# Patient Record
Sex: Female | Born: 1955
Health system: Southern US, Community
[De-identification: ages and names within clinical notes are randomized; demographics above are authoritative.]

## PROBLEM LIST (undated history)

## (undated) DIAGNOSIS — E669 Obesity, unspecified: Secondary | ICD-10-CM

## (undated) DIAGNOSIS — F32A Depression, unspecified: Secondary | ICD-10-CM

## (undated) DIAGNOSIS — R519 Headache, unspecified: Secondary | ICD-10-CM

## (undated) DIAGNOSIS — E8941 Symptomatic postprocedural ovarian failure: Secondary | ICD-10-CM

## (undated) DIAGNOSIS — N329 Bladder disorder, unspecified: Secondary | ICD-10-CM

## (undated) DIAGNOSIS — M720 Palmar fascial fibromatosis [Dupuytren]: Secondary | ICD-10-CM

## (undated) DIAGNOSIS — R51 Headache: Secondary | ICD-10-CM

## (undated) DIAGNOSIS — M722 Plantar fascial fibromatosis: Secondary | ICD-10-CM

## (undated) DIAGNOSIS — E782 Mixed hyperlipidemia: Secondary | ICD-10-CM

## (undated) DIAGNOSIS — F329 Major depressive disorder, single episode, unspecified: Secondary | ICD-10-CM

## (undated) HISTORY — PX: ANKLE FRACTURE SURGERY: SHX122

## (undated) HISTORY — DX: Bladder disorder, unspecified: N32.9

## (undated) HISTORY — DX: Palmar fascial fibromatosis (dupuytren): M72.0

## (undated) HISTORY — DX: Depression, unspecified: F32.A

## (undated) HISTORY — DX: Symptomatic postprocedural ovarian failure: E89.41

## (undated) HISTORY — DX: Headache, unspecified: R51.9

## (undated) HISTORY — DX: Obesity, unspecified: E66.9

## (undated) HISTORY — DX: Major depressive disorder, single episode, unspecified: F32.9

## (undated) HISTORY — DX: Mixed hyperlipidemia: E78.2

## (undated) HISTORY — DX: Headache: R51

## (undated) HISTORY — DX: Plantar fascial fibromatosis: M72.2

---

## 2004-06-11 ENCOUNTER — Encounter: Payer: Self-pay | Admitting: Family Medicine

## 2004-06-11 LAB — CONVERTED CEMR LAB

## 2004-06-11 LAB — HM MAMMOGRAPHY

## 2004-11-16 ENCOUNTER — Ambulatory Visit: Payer: Self-pay | Admitting: Family Medicine

## 2005-08-02 ENCOUNTER — Ambulatory Visit: Payer: Self-pay | Admitting: Family Medicine

## 2006-05-19 ENCOUNTER — Ambulatory Visit: Payer: Self-pay | Admitting: Family Medicine

## 2006-08-22 ENCOUNTER — Ambulatory Visit: Payer: Self-pay | Admitting: Family Medicine

## 2006-12-12 HISTORY — PX: VAGINAL HYSTERECTOMY: SUR661

## 2007-04-04 ENCOUNTER — Ambulatory Visit: Payer: Self-pay | Admitting: Family Medicine

## 2007-04-25 ENCOUNTER — Telehealth: Payer: Self-pay | Admitting: Family Medicine

## 2007-10-22 ENCOUNTER — Ambulatory Visit: Payer: Self-pay | Admitting: Family Medicine

## 2007-10-22 DIAGNOSIS — E669 Obesity, unspecified: Secondary | ICD-10-CM | POA: Insufficient documentation

## 2007-10-23 ENCOUNTER — Ambulatory Visit: Payer: Self-pay | Admitting: Family Medicine

## 2007-10-25 LAB — CONVERTED CEMR LAB
ALT: 17 units/L (ref 0–35)
AST: 19 units/L (ref 0–37)
Bilirubin, Direct: 0.1 mg/dL (ref 0.0–0.3)
Eosinophils Relative: 3.2 % (ref 0.0–5.0)
HCT: 38.5 % (ref 36.0–46.0)
Neutrophils Relative %: 68.4 % (ref 43.0–77.0)
RBC: 4.4 M/uL (ref 3.87–5.11)
RDW: 13.1 % (ref 11.5–14.6)
Total CHOL/HDL Ratio: 4.4
Total Protein: 7.5 g/dL (ref 6.0–8.3)
Triglycerides: 325 mg/dL (ref 0–149)
WBC: 6 10*3/uL (ref 4.5–10.5)

## 2007-10-31 LAB — CONVERTED CEMR LAB
ALT: 17 units/L (ref 0–35)
AST: 19 units/L (ref 0–37)
Basophils Relative: 0.6 % (ref 0.0–1.0)
Bilirubin, Direct: 0.1 mg/dL (ref 0.0–0.3)
CO2: 27 meq/L (ref 19–32)
Calcium: 9.6 mg/dL (ref 8.4–10.5)
Creatinine, Ser: 0.6 mg/dL (ref 0.4–1.2)
Eosinophils Relative: 3.2 % (ref 0.0–5.0)
GFR calc Af Amer: 137 mL/min
Glucose, Bld: 67 mg/dL — ABNORMAL LOW (ref 70–99)
Hemoglobin: 13.4 g/dL (ref 12.0–15.0)
Lymphocytes Relative: 22.6 % (ref 12.0–46.0)
Neutro Abs: 4.1 10*3/uL (ref 1.4–7.7)
Platelets: 333 10*3/uL (ref 150–400)
Total Bilirubin: 0.6 mg/dL (ref 0.3–1.2)
Total Protein: 7.5 g/dL (ref 6.0–8.3)
VLDL: 65 mg/dL — ABNORMAL HIGH (ref 0–40)
WBC: 6 10*3/uL (ref 4.5–10.5)

## 2007-12-04 ENCOUNTER — Ambulatory Visit: Payer: Self-pay | Admitting: Family Medicine

## 2007-12-04 DIAGNOSIS — F329 Major depressive disorder, single episode, unspecified: Secondary | ICD-10-CM

## 2007-12-04 DIAGNOSIS — E782 Mixed hyperlipidemia: Secondary | ICD-10-CM | POA: Insufficient documentation

## 2007-12-04 DIAGNOSIS — E785 Hyperlipidemia, unspecified: Secondary | ICD-10-CM | POA: Insufficient documentation

## 2007-12-04 DIAGNOSIS — J309 Allergic rhinitis, unspecified: Secondary | ICD-10-CM | POA: Insufficient documentation

## 2007-12-04 DIAGNOSIS — N329 Bladder disorder, unspecified: Secondary | ICD-10-CM | POA: Insufficient documentation

## 2007-12-04 DIAGNOSIS — F32A Depression, unspecified: Secondary | ICD-10-CM | POA: Insufficient documentation

## 2008-01-15 ENCOUNTER — Ambulatory Visit: Payer: Self-pay | Admitting: Family Medicine

## 2008-01-21 ENCOUNTER — Ambulatory Visit: Payer: Self-pay | Admitting: Family Medicine

## 2008-01-22 LAB — CONVERTED CEMR LAB
ALT: 14 units/L (ref 0–35)
AST: 18 units/L (ref 0–37)
Basophils Relative: 0.9 % (ref 0.0–1.0)
Bilirubin, Direct: 0.1 mg/dL (ref 0.0–0.3)
Calcium: 9 mg/dL (ref 8.4–10.5)
Chloride: 104 meq/L (ref 96–112)
Creatinine, Ser: 0.6 mg/dL (ref 0.4–1.2)
Eosinophils Relative: 3.8 % (ref 0.0–5.0)
Glucose, Bld: 91 mg/dL (ref 70–99)
HCT: 38 % (ref 36.0–46.0)
LDL Cholesterol: 115 mg/dL — ABNORMAL HIGH (ref 0–99)
Neutrophils Relative %: 58.8 % (ref 43.0–77.0)
Platelets: 312 10*3/uL (ref 150–400)
RBC: 4.34 M/uL (ref 3.87–5.11)
RDW: 12.4 % (ref 11.5–14.6)
Sodium: 141 meq/L (ref 135–145)
TSH: 1.9 microintl units/mL (ref 0.35–5.50)
Total Bilirubin: 0.3 mg/dL (ref 0.3–1.2)
Total CHOL/HDL Ratio: 3.7
VLDL: 30 mg/dL (ref 0–40)
WBC: 5.4 10*3/uL (ref 4.5–10.5)

## 2008-02-09 ENCOUNTER — Ambulatory Visit: Payer: Self-pay | Admitting: Family Medicine

## 2008-02-09 ENCOUNTER — Encounter: Payer: Self-pay | Admitting: Family Medicine

## 2008-02-19 ENCOUNTER — Telehealth: Payer: Self-pay | Admitting: Family Medicine

## 2008-04-14 ENCOUNTER — Telehealth: Payer: Self-pay | Admitting: Family Medicine

## 2008-06-03 ENCOUNTER — Ambulatory Visit: Payer: Self-pay | Admitting: Family Medicine

## 2008-07-11 ENCOUNTER — Ambulatory Visit: Payer: Self-pay | Admitting: Family Medicine

## 2008-08-04 ENCOUNTER — Telehealth (INDEPENDENT_AMBULATORY_CARE_PROVIDER_SITE_OTHER): Payer: Self-pay | Admitting: *Deleted

## 2008-08-11 ENCOUNTER — Ambulatory Visit: Payer: Self-pay | Admitting: Family Medicine

## 2008-08-11 DIAGNOSIS — J45909 Unspecified asthma, uncomplicated: Secondary | ICD-10-CM | POA: Insufficient documentation

## 2008-09-22 ENCOUNTER — Telehealth: Payer: Self-pay | Admitting: Family Medicine

## 2009-02-20 ENCOUNTER — Telehealth: Payer: Self-pay | Admitting: Family Medicine

## 2009-04-25 ENCOUNTER — Ambulatory Visit: Payer: Self-pay | Admitting: Family Medicine

## 2009-06-08 ENCOUNTER — Ambulatory Visit: Payer: Self-pay | Admitting: Family Medicine

## 2009-09-04 ENCOUNTER — Ambulatory Visit: Payer: Self-pay | Admitting: Family Medicine

## 2009-09-04 DIAGNOSIS — E8941 Symptomatic postprocedural ovarian failure: Secondary | ICD-10-CM | POA: Insufficient documentation

## 2009-09-14 ENCOUNTER — Telehealth: Payer: Self-pay | Admitting: Family Medicine

## 2009-09-15 ENCOUNTER — Telehealth: Payer: Self-pay | Admitting: Family Medicine

## 2010-01-23 ENCOUNTER — Ambulatory Visit: Payer: Self-pay | Admitting: Family Medicine

## 2010-03-16 ENCOUNTER — Telehealth: Payer: Self-pay | Admitting: Family Medicine

## 2010-03-16 ENCOUNTER — Ambulatory Visit: Payer: Self-pay | Admitting: Family Medicine

## 2010-03-16 DIAGNOSIS — M65331 Trigger finger, right middle finger: Secondary | ICD-10-CM | POA: Insufficient documentation

## 2010-03-16 DIAGNOSIS — M720 Palmar fascial fibromatosis [Dupuytren]: Secondary | ICD-10-CM | POA: Insufficient documentation

## 2010-03-26 ENCOUNTER — Encounter (INDEPENDENT_AMBULATORY_CARE_PROVIDER_SITE_OTHER): Payer: Self-pay | Admitting: *Deleted

## 2010-04-30 ENCOUNTER — Telehealth: Payer: Self-pay | Admitting: Family Medicine

## 2010-07-05 ENCOUNTER — Ambulatory Visit: Payer: Self-pay | Admitting: Family Medicine

## 2010-08-02 ENCOUNTER — Telehealth: Payer: Self-pay | Admitting: Family Medicine

## 2010-08-27 ENCOUNTER — Telehealth (INDEPENDENT_AMBULATORY_CARE_PROVIDER_SITE_OTHER): Payer: Self-pay | Admitting: *Deleted

## 2010-08-30 ENCOUNTER — Encounter (INDEPENDENT_AMBULATORY_CARE_PROVIDER_SITE_OTHER): Payer: Self-pay | Admitting: *Deleted

## 2010-09-07 ENCOUNTER — Telehealth: Payer: Self-pay | Admitting: Family Medicine

## 2011-01-11 NOTE — Progress Notes (Signed)
Summary: pt is out of town  Phone Note From Other Clinic   Caller: call a nurse Summary of Call: Pt left message with call a nurse that she is still in [place], her father passed away, she will call to reschedule her appt when she returns home.  Initial call taken by: Lowella Petties CMA,  September 07, 2010 10:47 AM  Follow-up for Phone Call        thanks for the heads up - do not charge her for no show please Follow-up by: Judith Part MD,  September 07, 2010 11:15 AM  Additional Follow-up for Phone Call Additional follow up Details #1::        I lt Lupita Leash know not to charge for 08/30/10. Additional Follow-up by: Lowella Petties CMA,  September 07, 2010 11:27 AM

## 2011-01-11 NOTE — Assessment & Plan Note (Signed)
Summary: FILL OUT FORM FOR WORK/[facility]   Vital Signs:  Patient profile:   55 year old female Height:      63 inches Weight:      204.50 pounds BMI:     36.36 Temp:     98 degrees F oral Pulse rate:   76 / minute Pulse rhythm:   regular BP sitting:   116 / 70  (left arm) Cuff size:   large  Vitals Entered By: Lewanda Rife LPN (March 16, 453 9:24 AM) CC: Form for work   History of Present Illness: is doing fine ok   is setting up for agency so she can get help with her parents -- they are in [place]  this is application for agency for [employer] -- to be on list for job   all titers show immunity   need dates for hep A and B vaccines- pt will obt these   has dupytens- in her R hand - is interested in opt for tx because is is starting to hurt and aff mobility in her hand    Allergies: 1)  ! Amoxicillin  Past History:  Past Medical History: Last updated: 09/04/2009 Allergic rhinitis Depression asthma- mild  plantar fasciitis   Past Surgical History: Last updated: 12/04/2007 Hysterectomy- vaginal.  Enterocele repair (2008)  Family History: Last updated: 01/15/2008 father- heart probs in old age/chf, prostate ca, smoker, PVD mother very healthy at 44  Social History: Last updated: 01/15/2008 works in ER- mental health- [employer] Never Smoked  Risk Factors: Smoking Status: never (01/15/2008)  Review of Systems General:  Denies fatigue, fever, loss of appetite, and malaise. Eyes:  Denies blurring and eye pain. CV:  Denies chest pain or discomfort and palpitations. Resp:  Denies cough, shortness of breath, sputum productive, and wheezing. GI:  Denies abdominal pain, change in bowel habits, indigestion, loss of appetite, nausea, and vomiting. MS:  Denies joint pain. Derm:  Denies itching, lesion(s), poor wound healing, and rash. Neuro:  Denies numbness and tingling. Psych:  Denies anxiety and depression; mood has been quite stable. Endo:  Denies cold intolerance,  excessive thirst, excessive urination, and heat intolerance. Heme:  Denies abnormal bruising and bleeding.  Physical Exam  General:  overweight but generally well appearing  Head:  normocephalic, atraumatic, and no abnormalities observed.   Eyes:  vision grossly intact, pupils equal, pupils round, and pupils reactive to light.  no conjunctival pallor, injection or icterus  Mouth:  pharynx pink and moist.   Neck:  supple with full rom and no masses or thyromegally, no JVD or carotid bruit  Chest Wall:  No deformities, masses, or tenderness noted. Lungs:  Normal respiratory effort, chest expands symmetrically. Lungs are clear to auscultation, no crackles or wheezes. Heart:  Normal rate and regular rhythm. S1 and S2 normal without gallop, murmur, click, rub or other extra sounds. Abdomen:  Bowel sounds positive,abdomen soft and non-tender without masses, organomegaly or hernias noted. Msk:  No deformity or scoliosis noted of thoracic or lumbar spine.  no acute joint changes thickening of tissues overlying tendons in R palm that are slt tender no redness or warmth no finger triggering  Extremities:  No clubbing, cyanosis, edema, or deformity noted with normal full range of motion of all joints.   Neurologic:  sensation intact to light touch, gait normal, and DTRs symmetrical and normal.   Skin:  Intact without suspicious lesions or rashes Cervical Nodes:  No lymphadenopathy noted Psych:  normal affect, talkative and  pleasant    Impression & Recommendations:  Problem # 1:  OTH GENERAL MEDICAL EXAMINATION ADMIN PURPOSES (ICD-V70.3) Assessment Comment Only for new job poss in [place] no restricions  pend dates of hep vaccines to finish  all titers nl  forms filled out   Problem # 2:  DUPUYTREN'S CONTRACTURE (ICD-728.6) Assessment: Deteriorated ref to hand specialist due to increasing symptoms and problems with mobility of hand  Orders: Orthopedic Referral (Ortho)  Complete  Medication List: 1)  Zoloft 100 Mg Tabs (Sertraline hcl) .... One by mouth once daily 2)  Premarin 0.625 Mg Tabs (Estrogens conjugated) .... Take one by mouth daily 3)  Ambien 10 Mg Tabs (Zolpidem tartrate) .... 1/2 to 1 by mouth at bedtime as needed 4)  Zyrtec Allergy 10 Mg Tabs (Cetirizine hcl) .Marland Kitchen.. 1 daily as needed by mouth 5)  Advair Diskus 100-50 Mcg/dose Misc (Fluticasone-salmeterol) .... Take 1 inhalation two times daily as needed 6)  Nasacort Aq 55 Mcg/act Aers (Triamcinolone acetonide(nasal)) .... Use 2 sprays in each nostril once daily as needed 7)  Multivitamins Tabs (Multiple vitamin) .... Daily  Patient Instructions: 1)  we will do hand doctor referral at check out  2)  keep working on healthy diet and exercise  3)  please get dates of your hep B and hep A immunizations  4)  then I will return your form  5)  no restrictions for new job   Current Allergies (reviewed today): ! AMOXICILLIN   Immunization History:  Tetanus/Td Immunization History:    Tetanus/Td:  historical (08/27/2009)  Influenza Immunization History:    Influenza:  historical (09/07/2009)

## 2011-01-11 NOTE — Letter (Signed)
Summary: Unable To Reach-Consult Scheduled  [place] at [facility]  [address] [place], [place] [address]   Phone: [phone]  Fax: [phone]    03/26/2010 MRN: [id number]    Dear Ms. [place],   We have been unable to reach you by phone.  Please contact our office with an updated phone number.  At the recommendation of Dr.___Marne Milinda Antis, we have been asked to schedule you a consult with  Orthopedic Physician Referral.  Our telephone number is [phone]. If you wish to decline an appointment. Please call our office.    If you have any question please call us.     Thank you,  Patient Care Coordinator [place] at [facility]

## 2011-01-11 NOTE — Progress Notes (Signed)
Summary: Vaccine dates  Phone Note Call from Patient Call back at [phone]   Caller: Patient Call For: Judith Part MD Summary of Call: Patient called to give dates of shots: Hep B 12/17/1992, 01/22/1993, and 07/16/1993.  Hep A 03/16/2010.  Please call her and let her know when she can pick up form.   Initial call taken by: Linde Gillis CMA Duncan Dull),  March 16, 2010 11:08 AM  Follow-up for Phone Call        please fill in on her form and scan it and call her for pick up - thanks  Follow-up by: Judith Part MD,  March 16, 2010 11:30 AM  Additional Follow-up for Phone Call Additional follow up Details #1::        Left message for patient to call back. Completed form has been copied for scanning and original is at front desk for pick up.Lewanda Rife LPN  March 16, 864 12:32 PM   Advised pt ok to pick up form.         Lowella Petties CMA  March 16, 2010 1:26 PM

## 2011-01-11 NOTE — Letter (Signed)
Summary: [place] No Show Letter  [place] at [facility]  [address] [place], [place] [address]   Phone: [phone]  Fax: [phone]    08/30/2010 MRN: [id number]  Ariel Compton [address] [place], [place]  [address]   Dear Ms. [place],   Our records indicate that you missed your scheduled appointment with ___lab__________________ on __9.19.11__________.  Please contact this office to reschedule your appointment as soon as possible.  It is important that you keep your scheduled appointments with your physician, so we can provide you the best care possible.  Please be advised that there may be a charge for "no show" appointments.    Sincerely,   [place] at [facility]

## 2011-01-11 NOTE — Progress Notes (Signed)
Summary: premarin  Phone Note Refill Request Call back at [phone] Message from:  Patient on August 02, 2010 10:53 AM  Refills Requested: Medication #1:  PREMARIN 0.625 MG  TABS take one by mouth daily Call husband when ready for pick up.   Initial call taken by: Melody Comas,  August 02, 2010 10:53 AM  Follow-up for Phone Call        her physical is already scheduled with me printed in put in nurse in box for pickup  Follow-up by: Judith Part MD,  August 02, 2010 11:19 AM  Additional Follow-up for Phone Call Additional follow up Details #1::        Prescription left at front desk. Left message for patient to call back. Lewanda Rife LPN  August 02, 2010 12:54 PM   Patient notified as instructed by telephone. Pt's husband will pick up rx on Friday.08/06/10. Lewanda Rife LPN  August 03, 2010 10:45 AM     Prescriptions: PREMARIN 0.625 MG  TABS (ESTROGENS CONJUGATED) take one by mouth daily  #90 x 3   Entered and Authorized by:   Judith Part MD   Signed by:   Judith Part MD on 08/02/2010   Method used:   Print then Give to Patient   RxID:   [id number]

## 2011-01-11 NOTE — Assessment & Plan Note (Signed)
Summary: sore throat cough x 3 weeks/rbh   Vital Signs:  Patient profile:   55 year old female Weight:      199 pounds O2 Sat:      97 % on Room air Temp:     98.1 degrees F oral Pulse rate:   82 / minute BP sitting:   124 / 80  (left arm)  Vitals Entered By: Doristine Devoid (January 23, 2010 9:30 AM)  O2 Flow:  Room air CC: sore throat and cough    History of Present Illness: Pt here Sat with fever to 101, taking Tyl. She works in the ED, sxs for couple wks...she has headache in the frontal area altho none now, some,  ear pain, rhinitis that is clear, mild ST recently, cough productive green occas, some SOB with stairs (uses Advair regularly this time of year), no N/V. She has taken Robitussin DM and Tyl and Nasocort.  Problems Prior to Update: 1)  Menopause, Surgical  (ICD-627.4) 2)  Other Screening Mammogram  (ICD-V76.12) 3)  Plantar Fasciitis, Right  (ICD-728.71) 4)  Asthma, Persistent, Mild  (ICD-493.90) 5)  Fatigue  (ICD-780.79) 6)  Other Screening Mammogram  (ICD-V76.12) 7)  Health Maintenance Exam  (ICD-V70.0) 8)  Hyperlipidemia  (ICD-272.2) 9)  Hx of Bladder Prolapse  (ICD-596.9) 10)  Depression  (ICD-311) 11)  Allergic Rhinitis  (ICD-477.9) 12)  Obesity  (ICD-278.00) 13)  Health Screening  (ICD-V70.0) 14)  Low Back Pain, Acute  (ICD-724.2)  Medications Prior to Update: 1)  Zoloft 100 Mg  Tabs (Sertraline Hcl) .... One By Mouth Once Daily 2)  Premarin 0.625 Mg  Tabs (Estrogens Conjugated) .... Take One By Mouth Daily 3)  Ambien 10 Mg  Tabs (Zolpidem Tartrate) .... 1/2 To 1 By Mouth At Bedtime As Needed 4)  Zyrtec Allergy 10 Mg  Tabs (Cetirizine Hcl) .Marland Kitchen.. 1 Daily As Needed By Mouth 5)  Advair Diskus 100-50 Mcg/dose  Misc (Fluticasone-Salmeterol) .... Take 1 Inhalation Two Times Daily 6)  Nasacort Aq 55 Mcg/act Aers (Triamcinolone Acetonide(Nasal)) .... Use 2 Sprays in Each Nostril Once Daily 7)  Multivitamins  Tabs (Multiple Vitamin) .... Daily  Allergies: 1)  !  Amoxicillin  Physical Exam  General:  overweight but generally well appearing, mildly congested.  Head:  normocephalic, atraumatic, and no abnormalities observed.  Mild  sinus tenderness, max>frontals. Eyes:  Conjunctiva clear bilaterally.  Ears:  TMs nml bilat, no erythema or distortion Nose:  Inflamed with cleaqr discharge. Mouth:  pharynx pink and moist, no erythema, and no exudates.  Mildly thick, clear PND. Neck:  supple with full rom and no masses or thyromegally, no JVD or carotid bruit  Lungs:  Normal respiratory effort, chest expands symmetrically. Lungs are clear to auscultation, no crackles or wheezes.   Impression & Recommendations:  Problem # 1:  URI (ICD-465.9) Assessment New Start Predniusone 50mg  daily for five days. If sxs worsen, start Biaxin. Take Guaifenesin by going to [facility] or [facility] and getting MUCOUS RELIEF EXPECTORANT (400mg ), take 11/2 tabs by mouth AM and NOON. Drink lots of fluids anytime taking Guaifenesin.  Tyl regularly. Her updated medication list for this problem includes:    Zyrtec Allergy 10 Mg Tabs (Cetirizine hcl) .Marland Kitchen... 1 daily as needed by mouth Call Mon if sxs worsen.  Complete Medication List: 1)  Zoloft 100 Mg Tabs (Sertraline hcl) .... One by mouth once daily 2)  Premarin 0.625 Mg Tabs (Estrogens conjugated) .... Take one by mouth daily 3)  Ambien 10  Mg Tabs (Zolpidem tartrate) .... 1/2 to 1 by mouth at bedtime as needed 4)  Zyrtec Allergy 10 Mg Tabs (Cetirizine hcl) .Marland Kitchen.. 1 daily as needed by mouth 5)  Advair Diskus 100-50 Mcg/dose Misc (Fluticasone-salmeterol) .... Take 1 inhalation two times daily 6)  Nasacort Aq 55 Mcg/act Aers (Triamcinolone acetonide(nasal)) .... Use 2 sprays in each nostril once daily 7)  Multivitamins Tabs (Multiple vitamin) .... Daily 8)  Biaxin 500 Mg Tabs (Clarithromycin) .... One tab by mouth two times a day 9)  Prednisone 50 Mg Tabs (Prednisone) .... One tab by mouth in  am Prescriptions: PREDNISONE 50 MG TABS (PREDNISONE) one tab by mouth in AM  #5 x 0   Entered and Authorized by:   Shaune Leeks MD   Signed by:   Shaune Leeks MD on 01/23/2010   Method used:   Print then Give to Patient   RxID:   [id number] BIAXIN 500 MG TABS (CLARITHROMYCIN) one tab by mouth two times a day  #20 x 0   Entered and Authorized by:   Shaune Leeks MD   Signed by:   Shaune Leeks MD on 01/23/2010   Method used:   Print then Give to Patient   RxID:   [id number]

## 2011-01-11 NOTE — Letter (Signed)
Summary: Health Status Form/[facility]  Health Status Form/[facility]   Imported By: Lanelle Bal 03/19/2010 08:25:22  _____________________________________________________________________  External Attachment:    Type:   Image     Comment:   External Document

## 2011-01-11 NOTE — Progress Notes (Signed)
Summary: Declined to accept Ortho referral...  Phone Note Outgoing Call   Summary of Call: Refused Orthopedic Referral at [phone] Pt says she did not want this appt, says she will schedule her own appt w/ another dr. Pt did not want me to reschedule w/ another physician.Daine Gip  Apr 30, 2010 3:33 PM  Initial call taken by: Daine Gip,  Apr 30, 2010 3:33 PM  Follow-up for Phone Call        ok, thanks for the update  Follow-up by: Judith Part MD,  Apr 30, 2010 5:09 PM

## 2011-01-11 NOTE — Progress Notes (Signed)
----   Converted from flag ---- ---- 08/26/2010 3:57 PM, Colon Flattery [name] MD wrote: please check wellness and lipid v70.0 and 272 thanks  ---- 08/26/2010 9:37 AM, Liane Comber CMA (AAMA) wrote: Lab orders please! Good Morning! This pt is scheduled for cpx labs Monday, which labs to draw and dx codes to use? Thanks Tasha ------------------------------

## 2011-01-11 NOTE — Assessment & Plan Note (Signed)
Summary: DISCUSS MEDICATIONS/CLE   Vital Signs:  Patient profile:   55 year old female Height:      63 inches Weight:      209.75 pounds BMI:     37.29 Temp:     97.6 degrees F oral Pulse rate:   76 / minute Pulse rhythm:   regular BP sitting:   116 / 74  (left arm) Cuff size:   large  Vitals Entered By: Lewanda Rife LPN (July 05, 2010 8:04 AM) CC: discuss meds   History of Present Illness: thinks she needs to inc her zoloft   her parents are not doing well  her best friend just died of a PE- young  is tired and gaining weight  joined wt [employer] / trying to go to the gym  not sleeping well  cannot go to or stay asleep    is very anxious and then irritable  not too tearful  devastated tired and unmotavated  no professional counseling yet- still trying to move to [employer] -- to help her parents   no SI at all   Allergies: 1)  ! Amoxicillin  Review of Systems General:  Complains of fatigue; denies chills, fever, loss of appetite, and malaise. Eyes:  Denies blurring and eye irritation. CV:  Denies chest pain or discomfort and palpitations. Resp:  Denies cough and wheezing. GI:  Denies abdominal pain, indigestion, and vomiting. Neuro:  Denies headaches, memory loss, numbness, tingling, and tremors; does not drink caffiene as a rule. Psych:  Complains of anxiety, depression, and irritability; denies panic attacks, sense of great danger, and suicidal thoughts/plans. Endo:  Denies cold intolerance and heat intolerance. Heme:  Denies abnormal bruising and bleeding.  Physical Exam  General:  overweight but generally well appearing  Head:  normocephalic, atraumatic, and no abnormalities observed.   Eyes:  vision grossly intact, pupils equal, pupils round, and pupils reactive to light.  no conjunctival pallor, injection or icterus  Neck:  supple with full rom and no masses or thyromegally, no JVD or carotid bruit  Lungs:  Normal respiratory effort, chest expands  symmetrically. Lungs are clear to auscultation, no crackles or wheezes. Heart:  Normal rate and regular rhythm. S1 and S2 normal without gallop, murmur, click, rub or other extra sounds. Extremities:  No clubbing, cyanosis, edema, or deformity noted with normal full range of motion of all joints.   Neurologic:  sensation intact to light touch, gait normal, and DTRs symmetrical and normal.  no tremor  Skin:  Intact without suspicious lesions or rashes Cervical Nodes:  No lymphadenopathy noted Psych:  seems fatigued not tearful good eye contact and comm skills good insight    Impression & Recommendations:  Problem # 1:  DEPRESSION (ICD-311) Assessment Deteriorated this is worse with recent situational stress and loss  disc stressors/ coping tech/ symptoms / tx opt and side eff in detail today will inc to total dose of 150 mg daily  given px for mail in and called 1 mo to pharmacy  disc pot side eff  if worse or any SI- aware to get help asap  f/u 2 mo at her PE  enc to keep exercising  recommend counseling when she can get it  refilled ambien as needed for sleep- to use occas The following medications were removed from the medication list:    Zoloft 100 Mg Tabs (Sertraline hcl) .Marland Kitchen... 11/2 by mouth once daily Her updated medication list for this problem includes:    Zoloft  100 Mg Tabs (Sertraline hcl) .Marland Kitchen... 11/2 by mouth once daily  Complete Medication List: 1)  Premarin 0.625 Mg Tabs (Estrogens conjugated) .... Take one by mouth daily 2)  Ambien 10 Mg Tabs (Zolpidem tartrate) .... 1/2 to 1 by mouth at bedtime as needed 3)  Zyrtec Allergy 10 Mg Tabs (Cetirizine hcl) .Marland Kitchen.. 1 daily as needed by mouth 4)  Advair Diskus 100-50 Mcg/dose Misc (Fluticasone-salmeterol) .... Take 1 inhalation two times daily as needed 5)  Nasacort Aq 55 Mcg/act Aers (Triamcinolone acetonide(nasal)) .... Use 2 sprays in each nostril once daily as needed 6)  Multivitamins Tabs (Multiple vitamin) .... Daily 7)   Zoloft 100 Mg Tabs (Sertraline hcl) .Marland Kitchen.. 11/2 by mouth once daily  Patient Instructions: 1)  keep up the good exercise  2)  think about counseling in the future for situational stress  3)  increase your zoloft to 11/2 tablets -- total dose of 150 mg  4)  update me if you get worse or side effects  5)  follow up with me in 2 months  Prescriptions: ZOLOFT 100 MG TABS (SERTRALINE HCL) 11/2 by mouth once daily  #3 months x 3   Entered and Authorized by:   Judith Part MD   Signed by:   Judith Part MD on 07/05/2010   Method used:   Print then Give to Patient   RxID:   [id number] AMBIEN 10 MG  TABS (ZOLPIDEM TARTRATE) 1/2 to 1 by mouth at bedtime as needed  #30 x 0   Entered and Authorized by:   Judith Part MD   Signed by:   Judith Part MD on 07/05/2010   Method used:   Print then Give to Patient   RxID:   [id number] ZOLOFT 50 MG TABS (SERTRALINE HCL) 1 1/2 by mouth once daily  #3 months x  3   Entered and Authorized by:   Judith Part MD   Signed by:   Judith Part MD on 07/05/2010   Method used:   Print then Give to Patient   RxID:   [id number] ZOLOFT 100 MG  TABS (SERTRALINE HCL) 11/2 by mouth once daily  #45 x 0   Entered and Authorized by:   Judith Part MD   Signed by:   Judith Part MD on 07/05/2010   Method used:   Electronically to        [facility]. [address] [phone]* (retail)       [address] [place], [place]  [id number]       Ph: [phone]       Fax: [phone]   RxID:   [id number]   Current Allergies (reviewed today): ! AMOXICILLIN

## 2011-05-08 ENCOUNTER — Other Ambulatory Visit: Payer: Self-pay | Admitting: Family Medicine

## 2011-05-18 ENCOUNTER — Encounter: Payer: Self-pay | Admitting: Family Medicine

## 2011-05-19 ENCOUNTER — Ambulatory Visit (INDEPENDENT_AMBULATORY_CARE_PROVIDER_SITE_OTHER): Payer: BC Managed Care – PPO | Admitting: Family Medicine

## 2011-05-19 ENCOUNTER — Encounter: Payer: Self-pay | Admitting: Family Medicine

## 2011-05-19 DIAGNOSIS — M549 Dorsalgia, unspecified: Secondary | ICD-10-CM

## 2011-05-19 DIAGNOSIS — J45909 Unspecified asthma, uncomplicated: Secondary | ICD-10-CM

## 2011-05-19 MED ORDER — FLUTICASONE-SALMETEROL 100-50 MCG/DOSE IN AEPB: 1.0000 | INHALATION_SPRAY | Freq: Two times a day (BID) | RESPIRATORY_TRACT | Status: DC

## 2011-05-19 MED ORDER — TRIAMCINOLONE ACETONIDE(NASAL) 55 MCG/ACT NA INHA: 2.0000 | Freq: Every day | NASAL | Status: DC | PRN

## 2011-05-19 MED ORDER — CYCLOBENZAPRINE HCL 5 MG PO TABS: 5.0000 mg | ORAL_TABLET | Freq: Three times a day (TID) | ORAL | Status: DC | PRN

## 2011-05-19 NOTE — Patient Instructions (Signed)
I would use the flexeril as needed.  It can make you drowsy.  I would use the single and double knee to chest stretches along with the modified push up.  Take care.

## 2011-05-19 NOTE — Assessment & Plan Note (Signed)
Improved w/o red flag symptoms.  D/w pt [id number] and sedation caution on flexeril. She understood.  Fu prn.

## 2011-05-19 NOTE — Assessment & Plan Note (Signed)
Continue current meds.  Controlled.  No wheeze.

## 2011-05-19 NOTE — Progress Notes (Signed)
H/o distant back injury, years ago.  Every once in a while, it'll flare up.  No known trigger with this episode.  Pain started over the weekend.  R lower back pain.  No radiation into leg.  No weakness.  Was prev walking with limp. No bowel/bladder changes.  Better now after walking and taking flexeril (5mg  as needed).    H/o mild asthma, triggered by [place] [employer].  Nonsmoker.  Needs refills.  Doing well o/w.   nad ncat rrr Ctab, no wheeze Back w/o midline pain R paraspinal tenderness.  SLR neg.  R SI joint tender on testing.  Distally nv intact.

## 2011-07-07 ENCOUNTER — Ambulatory Visit (INDEPENDENT_AMBULATORY_CARE_PROVIDER_SITE_OTHER): Payer: BC Managed Care – PPO | Admitting: Family Medicine

## 2011-07-07 ENCOUNTER — Encounter: Payer: Self-pay | Admitting: Family Medicine

## 2011-07-07 DIAGNOSIS — Z79890 Hormone replacement therapy (postmenopausal): Secondary | ICD-10-CM | POA: Insufficient documentation

## 2011-07-07 DIAGNOSIS — M549 Dorsalgia, unspecified: Secondary | ICD-10-CM

## 2011-07-07 DIAGNOSIS — F329 Major depressive disorder, single episode, unspecified: Secondary | ICD-10-CM

## 2011-07-07 DIAGNOSIS — F3289 Other specified depressive episodes: Secondary | ICD-10-CM

## 2011-07-07 MED ORDER — CYCLOBENZAPRINE HCL 5 MG PO TABS: 5.0000 mg | ORAL_TABLET | Freq: Three times a day (TID) | ORAL | Status: DC | PRN

## 2011-07-07 MED ORDER — SERTRALINE HCL 100 MG PO TABS: 150.0000 mg | ORAL_TABLET | Freq: Every day | ORAL | Status: DC

## 2011-07-07 MED ORDER — ESTROGENS CONJUGATED 0.45 MG PO TABS: 0.4500 mg | ORAL_TABLET | Freq: Every day | ORAL | Status: DC

## 2011-07-07 NOTE — Progress Notes (Signed)
Back pain.  Weight shifted when a patient fell into her at work.  B paraspinal pain, upper L spine, lower T spine.  This has happened before, but usually is lower.  No leg weakness, no bowel/bladder sx.  Flexeril had helped prev.  No ADE with that med- 5mg /dose.    MDD- mood controlled.  No SI/HI.  Mood is improve.  She has activities that she looks forward to.  Needs refill on SSRI.   HRT-for mood swings.  Prev with good relief.  Had hysterectomy.  We talked about risk of continued therapy and she agrees to trial of lower dose.    Meds, vitals, and allergies reviewed.   ROS: See HPI.  Otherwise, noncontributory.  nad Affect wnl Neck supple rrr ctab Back w/o midline pain but B paraspinal muscle tenderness in lower T spine and upper L spine No bruising in the area Distally with normal motor function

## 2011-07-07 NOTE — Assessment & Plan Note (Signed)
Controlled, continue current meds.  No SI/HI.

## 2011-07-07 NOTE — Assessment & Plan Note (Signed)
Restart flexeril with stretching and local heat.  Okay for outpatient f/u. She agrees. No indication to image.

## 2011-07-07 NOTE — Patient Instructions (Signed)
Call me if the lower dose of premarin doesn't work.  Use the flexeril and local heat.  Take care. Glad to see you.

## 2011-07-07 NOTE — Assessment & Plan Note (Signed)
Will attempt to taper the dose.  Will start lower dose, she'll call back if inc in sx.  She agrees with plan.

## 2011-07-25 ENCOUNTER — Other Ambulatory Visit: Payer: Self-pay | Admitting: *Deleted

## 2011-07-25 MED ORDER — ESTROGENS CONJUGATED 0.45 MG PO TABS: 0.4500 mg | ORAL_TABLET | Freq: Every day | ORAL | Status: DC

## 2011-07-25 MED ORDER — SERTRALINE HCL 100 MG PO TABS: 150.0000 mg | ORAL_TABLET | Freq: Every day | ORAL | Status: DC

## 2011-12-19 ENCOUNTER — Encounter: Payer: Self-pay | Admitting: Family Medicine

## 2011-12-19 ENCOUNTER — Ambulatory Visit (INDEPENDENT_AMBULATORY_CARE_PROVIDER_SITE_OTHER): Payer: PRIVATE HEALTH INSURANCE | Admitting: Family Medicine

## 2011-12-19 VITALS — BP 124/80 | HR 84 | Temp 98.4°F | Wt 206.2 lb

## 2011-12-19 DIAGNOSIS — M720 Palmar fascial fibromatosis [Dupuytren]: Secondary | ICD-10-CM

## 2011-12-19 MED ORDER — CYCLOBENZAPRINE HCL 5 MG PO TABS: 5.0000 mg | ORAL_TABLET | Freq: Three times a day (TID) | ORAL | Status: DC | PRN

## 2011-12-19 NOTE — Progress Notes (Signed)
  Subjective:    Patient ID: Ariel Compton, female    DOB: [birth year], 56 y.o.   MRN: [id number]  HPI CC: discuss dupuytren's contracture  H/o dupuytren's contracture.  Recently changed insurance, told needs to wait 1 year for surgery (preexisting condition).  Wants specific surgery (aponeurotomy) but that [place] done [place] by [facility] or [place].  Sees hand doctor locally in [place].  Starting to act up again, wakes her up at night in pain.  Last had steroid injection 1 year ago, helped temporarily.  Has had 2 shots of steroids total.  Finds flexeril helps, but out of this med.  Would like refill today.  Main reason for visit today.  No other fevers/chills, pain elsewhere.  No spreading redness.  Doing stretching exercises prescribed by hand surgery.  Works at [employer].  Review of Systems Per HPI    Objective:   Physical Exam  Nursing note and vitals reviewed. Constitutional: She appears well-developed and well-nourished. No distress.  Cardiovascular:  Pulses:      Radial pulses are 2+ on the right side, and 2+ on the left side.  Musculoskeletal:       Dupuytren's contracture of 4th flexor tendon R hand       Assessment & Plan:

## 2011-12-19 NOTE — Assessment & Plan Note (Signed)
Refilled flexeril. Will check with Dr. Salena Saner to see if he injects these. Alternatively adivsed pt she can call hand doctor for consideration of re injection

## 2011-12-19 NOTE — Patient Instructions (Signed)
Good to see you today. I will check with Dr. Salena Saner about injection. Or you could call hand surgery to see if they will repeat injection. I refilled flexeril

## 2011-12-28 ENCOUNTER — Telehealth: Payer: Self-pay | Admitting: Family Medicine

## 2011-12-28 NOTE — Telephone Encounter (Signed)
Please notify pt I checked and we don't do dupuytren's contracture injections here.

## 2011-12-29 NOTE — Telephone Encounter (Signed)
Message left notifying patient. Advised to call with questions or if referral was needed.

## 2012-04-17 ENCOUNTER — Ambulatory Visit (INDEPENDENT_AMBULATORY_CARE_PROVIDER_SITE_OTHER): Payer: PRIVATE HEALTH INSURANCE | Admitting: Family Medicine

## 2012-04-17 ENCOUNTER — Encounter: Payer: Self-pay | Admitting: Family Medicine

## 2012-04-17 VITALS — BP 112/82 | HR 78 | Temp 97.9°F | Ht 66.0 in | Wt 208.5 lb

## 2012-04-17 DIAGNOSIS — M542 Cervicalgia: Secondary | ICD-10-CM | POA: Insufficient documentation

## 2012-04-17 DIAGNOSIS — F3289 Other specified depressive episodes: Secondary | ICD-10-CM

## 2012-04-17 DIAGNOSIS — F329 Major depressive disorder, single episode, unspecified: Secondary | ICD-10-CM

## 2012-04-17 MED ORDER — ZOLPIDEM TARTRATE 10 MG PO TABS: 10.0000 mg | ORAL_TABLET | Freq: Every evening | ORAL | Status: DC | PRN

## 2012-04-17 MED ORDER — SERTRALINE HCL 100 MG PO TABS: 150.0000 mg | ORAL_TABLET | Freq: Every day | ORAL | Status: DC

## 2012-04-17 MED ORDER — METAXALONE 800 MG PO TABS: 800.0000 mg | ORAL_TABLET | Freq: Three times a day (TID) | ORAL | Status: AC

## 2012-04-17 NOTE — Assessment & Plan Note (Signed)
Pain and tenderness - R side of neck and trapezius area -  No rash / abd pain or other symptoms  Flexeril is not working well - will change to skelaxin Heat/ cervical support pillow  Ref to PT No neurol symptoms If not imp - consider films

## 2012-04-17 NOTE — Assessment & Plan Note (Signed)
Tremendous stress with family issues but overall doing ok with that  Refilled zoloft today Will return for health mt visit Recommend exercise

## 2012-04-17 NOTE — Progress Notes (Signed)
Subjective:    Patient ID: Ariel Compton, female    DOB: [birth year], 56 y.o.   MRN: [id number]  HPI Here for f/u of chronic conditions and also neck pain   Started on Friday - R side stiff and sore/ neck and also just below trapezius area  Pain is sharp  Moving R shoulder  No change with neck movement  Pain travels a little to the left - not to the arm  Taking flexeril 10 mg at a time -- and not helpful  No numbness or weakness   Sleeps with medium firm pillow that is rather short   Father passed away  Mother got cancer - she did bounce back pretty well , and hip fracture (breast cancer too)  Insurance was off for 5 weeks  Husband has pituitary adenoma / low testosterone  zoloft still works very well   Dropped premarin down to .45    Dupuytren's contracture  Dr Reece Agar gave her 5 mg of flexeril  Tried that for neck- not very helpful  Patient Active Problem List  Diagnoses  . HYPERLIPIDEMIA  . OBESITY  . DEPRESSION  . ALLERGIC RHINITIS  . ASTHMA, PERSISTENT, MILD  . BLADDER PROLAPSE  . MENOPAUSE, SURGICAL  . DUPUYTREN'S CONTRACTURE  . Back pain  . Postmenopausal HRT (hormone replacement therapy)  . Neck pain on right side   Past Medical History  Diagnosis Date  . Allergic rhinitis   . Depression   . Asthma     mild  . Plantar fasciitis   . Unspecified disorder of bladder   . Contracture of palmar fascia   . Mixed hyperlipidemia   . Symptomatic states associated with artificial menopause   . Obesity, unspecified    Past Surgical History  Procedure Date  . Vaginal hysterectomy 2008    Enterocele repair   History  Substance Use Topics  . Smoking status: Never Smoker   . Smokeless tobacco: Not on file  . Alcohol Use: No   Family History  Problem Relation Age of Onset  . Peripheral vascular disease Father     Smoker  . Prostate cancer Father   . Heart failure Father   . Other Father     Heart "problems"  . Healthy Mother   . Breast cancer Mother     Allergies  Allergen Reactions  . Amoxicillin     REACTION: GI UPSET   Current Outpatient Prescriptions on File Prior to Visit  Medication Sig Dispense Refill  . estrogens, conjugated, (PREMARIN) 0.45 MG tablet Take 1 tablet (0.45 mg total) by mouth daily.  90 tablet  3  . Multiple Vitamin (MULTIVITAMIN) tablet Take 1 tablet by mouth daily.        . sertraline (ZOLOFT) 100 MG tablet Take 1.5 tablets (150 mg total) by mouth daily.  135 tablet  3       Review of Systems Review of Systems  Constitutional: Negative for fever, appetite change, fatigue and unexpected weight change.  Eyes: Negative for pain and visual disturbance.  Respiratory: Negative for cough and shortness of breath.   Cardiovascular: Negative for cp or palpitations    Gastrointestinal: Negative for nausea, diarrhea and constipation.  Genitourinary: Negative for urgency and frequency.  Skin: Negative for pallor or rash   MSK pos for neck and shoulder pain , neg for joint swelling  Neurological: Negative for weakness, light-headedness, numbness and headaches.  Hematological: Negative for adenopathy. Does not bruise/bleed easily.  Psychiatric/Behavioral: pos for depression and anxiety  that are controlled with medication .         Objective:   Physical Exam  Constitutional: She appears well-developed and well-nourished. No distress.  HENT:  Head: Normocephalic and atraumatic.  Mouth/Throat: Oropharynx is clear and moist.  Eyes: Conjunctivae and EOM are normal. Pupils are equal, round, and reactive to light.  Neck: Normal range of motion. Neck supple. No JVD present. No thyromegaly present.  Cardiovascular: Normal rate, regular rhythm and normal heart sounds.   Pulmonary/Chest: Effort normal and breath sounds normal. No respiratory distress. She has no wheezes.  Abdominal: Soft. Bowel sounds are normal. She exhibits no distension and no mass. There is no tenderness.  Musculoskeletal: Normal range of motion. She  exhibits tenderness. She exhibits no edema.       L trapezius area is tender to palpation  No neck bony tenderness Nl rom neck  Some pain on abd and ext of arm No acromion tenderness   Lymphadenopathy:    She has no cervical adenopathy.  Neurological: She is alert. She has normal reflexes. No cranial nerve deficit. She exhibits normal muscle tone. Coordination normal.  Skin: Skin is warm and dry. No rash noted. No erythema. No pallor.  Psychiatric: She has a normal mood and affect.          Assessment & Plan:

## 2012-04-17 NOTE — Patient Instructions (Signed)
Get a foam cervical support pillow  Use heat as needed We will do referral to PT at check out  Try skelaxin with caution  Update if not starting to improve in a week or if worsening

## 2012-06-25 ENCOUNTER — Other Ambulatory Visit: Payer: Self-pay

## 2012-06-25 NOTE — Telephone Encounter (Signed)
Pt requesting Premarin refill; pt should have refills thru Aug 2013. Pt will ck with pharmacy.

## 2012-06-30 ENCOUNTER — Ambulatory Visit: Payer: Self-pay | Admitting: Internal Medicine

## 2012-07-06 ENCOUNTER — Encounter: Payer: Self-pay | Admitting: Family Medicine

## 2012-07-23 ENCOUNTER — Encounter: Payer: Self-pay | Admitting: Family Medicine

## 2012-07-23 ENCOUNTER — Ambulatory Visit (INDEPENDENT_AMBULATORY_CARE_PROVIDER_SITE_OTHER): Payer: PRIVATE HEALTH INSURANCE | Admitting: Family Medicine

## 2012-07-23 VITALS — BP 132/81 | HR 75 | Temp 98.1°F | Ht 65.0 in | Wt 203.8 lb

## 2012-07-23 DIAGNOSIS — J45909 Unspecified asthma, uncomplicated: Secondary | ICD-10-CM

## 2012-07-23 DIAGNOSIS — Z1231 Encounter for screening mammogram for malignant neoplasm of breast: Secondary | ICD-10-CM | POA: Insufficient documentation

## 2012-07-23 DIAGNOSIS — Z79890 Hormone replacement therapy (postmenopausal): Secondary | ICD-10-CM

## 2012-07-23 DIAGNOSIS — F3289 Other specified depressive episodes: Secondary | ICD-10-CM

## 2012-07-23 DIAGNOSIS — F329 Major depressive disorder, single episode, unspecified: Secondary | ICD-10-CM

## 2012-07-23 MED ORDER — ESTROGENS CONJUGATED 0.45 MG PO TABS: 0.4500 mg | ORAL_TABLET | Freq: Every day | ORAL | Status: DC

## 2012-07-23 MED ORDER — SERTRALINE HCL 100 MG PO TABS: 150.0000 mg | ORAL_TABLET | Freq: Every day | ORAL | Status: DC

## 2012-07-23 MED ORDER — FLUTICASONE-SALMETEROL 100-50 MCG/DOSE IN AEPB: 1.0000 | INHALATION_SPRAY | Freq: Two times a day (BID) | RESPIRATORY_TRACT | Status: DC

## 2012-07-23 NOTE — Assessment & Plan Note (Signed)
Pt is doing well with grief and stressors Refilled zoloft No problems

## 2012-07-23 NOTE — Assessment & Plan Note (Signed)
Disc breast ca risks with fam hx  Pt chooses to stay on this despite risks due to severe menop symptoms

## 2012-07-23 NOTE — Patient Instructions (Addendum)
Take care of yourself  meds refilled  See you in December  We will schedule mammogram at check out

## 2012-07-23 NOTE — Progress Notes (Signed)
Subjective:    Patient ID: Ariel Compton, female    DOB: 10-Dec-1956, 56 y.o.   MRN: [id number]  HPI Here for f/u of chronic conditions Is doing pretty well overall   Last visit tremendous stressors Disc mood and med zoloft Had a lot going on and lost her mother  Changes at work - due to [employer] of stress at work   Asthma - seasonal allergies  This is a bad time of year  A  Month ago -had bronchitis - took levaquin and much better  Still using advair Is taking care of herself    Health mt -needs order for mammo  [facility] - where she goes  Mother had breast cancer in her 58s  Pt cut her HRT - started having hot flashes again   No longer sees gyn  Will be returning here for gyn exam/ PE  Patient Active Problem List  Diagnosis  . HYPERLIPIDEMIA  . OBESITY  . DEPRESSION  . ALLERGIC RHINITIS  . ASTHMA, PERSISTENT, MILD  . BLADDER PROLAPSE  . MENOPAUSE, SURGICAL  . DUPUYTREN'S CONTRACTURE  . Back pain  . Postmenopausal HRT (hormone replacement therapy)  . Neck pain on right side  . Other screening mammogram   Past Medical History  Diagnosis Date  . Allergic rhinitis   . Depression   . Asthma     mild  . Plantar fasciitis   . Unspecified disorder of bladder   . Contracture of palmar fascia   . Mixed hyperlipidemia   . Symptomatic states associated with artificial menopause   . Obesity, unspecified    Past Surgical History  Procedure Date  . Vaginal hysterectomy 2008    Enterocele repair   History  Substance Use Topics  . Smoking status: Never Smoker   . Smokeless tobacco: Not on file  . Alcohol Use: No   Family History  Problem Relation Age of Onset  . Peripheral vascular disease Father     Smoker  . Prostate cancer Father   . Heart failure Father   . Other Father     Heart "problems"  . Healthy Mother   . Breast cancer Mother    Allergies  Allergen Reactions  . Amoxicillin     REACTION: GI UPSET   Current Outpatient Prescriptions on File  Prior to Visit  Medication Sig Dispense Refill  . estrogens, conjugated, (PREMARIN) 0.45 MG tablet Take 1 tablet (0.45 mg total) by mouth daily.  90 tablet  3  . Multiple Vitamin (MULTIVITAMIN) tablet Take 1 tablet by mouth daily.        . sertraline (ZOLOFT) 100 MG tablet Take 1.5 tablets (150 mg total) by mouth daily.  135 tablet  3       Review of Systems Review of Systems  Constitutional: Negative for fever, appetite change, fatigue and unexpected weight change.  Eyes: Negative for pain and visual disturbance.  Respiratory: Negative for cough and shortness of breath.   Cardiovascular: Negative for cp or palpitations    Gastrointestinal: Negative for nausea, diarrhea and constipation.  Genitourinary: Negative for urgency and frequency.  Skin: Negative for pallor or rash   Neurological: Negative for weakness, light-headedness, numbness and headaches.  Hematological: Negative for adenopathy. Does not bruise/bleed easily.  Psychiatric/Behavioral: Negative for dysphoric mood. The patient is not nervous/anxious.         Objective:   Physical Exam  Constitutional: She appears well-developed and well-nourished. No distress.  HENT:  Head: Normocephalic and atraumatic.  Mouth/Throat:  Oropharynx is clear and moist.  Eyes: Conjunctivae and EOM are normal. Pupils are equal, round, and reactive to light. No scleral icterus.  Neck: Normal range of motion. Neck supple. No JVD present. Carotid bruit is not present. No thyromegaly present.  Cardiovascular: Normal rate, regular rhythm and normal heart sounds.   Pulmonary/Chest: Effort normal and breath sounds normal. No respiratory distress. She has no wheezes.       No wheeze even on forced exp Good air exch  Abdominal: Soft. Bowel sounds are normal. She exhibits no distension, no abdominal bruit and no mass. There is no tenderness.  Musculoskeletal: She exhibits no edema and no tenderness.  Lymphadenopathy:    She has no cervical  adenopathy.  Neurological: She is alert. She has normal reflexes. No cranial nerve deficit. She exhibits normal muscle tone. Coordination normal.  Skin: Skin is warm and dry. No rash noted. No erythema. No pallor.  Psychiatric: She has a normal mood and affect. Her mood appears not anxious. She does not exhibit a depressed mood.          Assessment & Plan:

## 2012-07-23 NOTE — Assessment & Plan Note (Signed)
Refilled advair- works well Reassuring exam Disc avoiding allergens

## 2012-07-23 NOTE — Assessment & Plan Note (Signed)
Mam sched Will do breast exam at upcoming PE

## 2012-11-05 ENCOUNTER — Ambulatory Visit: Payer: Self-pay | Admitting: Medical

## 2012-11-06 ENCOUNTER — Telehealth: Payer: Self-pay

## 2012-11-06 DIAGNOSIS — M542 Cervicalgia: Secondary | ICD-10-CM

## 2012-11-06 DIAGNOSIS — M549 Dorsalgia, unspecified: Secondary | ICD-10-CM

## 2012-11-06 NOTE — Telephone Encounter (Signed)
Where in back? Where is the pain? Any radiation of the pain? - let me know and send for the urgent care note also, thanks

## 2012-11-06 NOTE — Telephone Encounter (Signed)
Pt was seen at walk in and diagnosed with pinch nerve. Pt request referral for PT at [facility].Please advise. Unable to reach pt by phone for more information.

## 2012-11-07 NOTE — Telephone Encounter (Signed)
Left voicemail requesting pt to call office, will try to call back later 

## 2012-11-09 NOTE — Telephone Encounter (Signed)
Patient left message that she was seen at a urgent care in [place], .left message to have patient return my call.

## 2012-11-11 ENCOUNTER — Telehealth: Payer: Self-pay | Admitting: Family Medicine

## 2012-11-11 DIAGNOSIS — Z0000 Encounter for general adult medical examination without abnormal findings: Secondary | ICD-10-CM | POA: Insufficient documentation

## 2012-11-11 DIAGNOSIS — E782 Mixed hyperlipidemia: Secondary | ICD-10-CM

## 2012-11-11 NOTE — Telephone Encounter (Signed)
Message copied by Judy Pimple on Sun Nov 11, 2012 10:17 AM ------      Message from: Baldomero Lamy      Created: Thu Nov 01, 2012 10:28 AM      Regarding: Cpx labs 12/2 Mon       Please order  future cpx labs for pt's upcomming lab appt.      Thanks      Rodney Booze

## 2012-11-12 ENCOUNTER — Other Ambulatory Visit: Payer: PRIVATE HEALTH INSURANCE

## 2012-11-12 NOTE — Telephone Encounter (Signed)
Will do PT ref

## 2012-11-12 NOTE — Telephone Encounter (Signed)
Left voicemail requesting pt to call office, will try to call back later 

## 2012-11-12 NOTE — Telephone Encounter (Signed)
Pt was seen for upper right back pain, right behind shoulder, it radiates sometimes to her neck, pt was seen by you before for this pain and you referred her to PT, pt didn't go because pain was better, but the pain came back so pt would like referral, pt was seen at the [facility], I faxed over request for notes from that visit

## 2012-11-12 NOTE — Telephone Encounter (Signed)
Received UC notes and placed in your Inbox

## 2012-11-13 ENCOUNTER — Ambulatory Visit (INDEPENDENT_AMBULATORY_CARE_PROVIDER_SITE_OTHER)
Admission: RE | Admit: 2012-11-13 | Discharge: 2012-11-13 | Disposition: A | Discharge status: Home / Self Care | Payer: PRIVATE HEALTH INSURANCE | Source: Ambulatory Visit | Attending: Family Medicine | Admitting: Family Medicine

## 2012-11-13 ENCOUNTER — Ambulatory Visit (INDEPENDENT_AMBULATORY_CARE_PROVIDER_SITE_OTHER): Payer: PRIVATE HEALTH INSURANCE | Admitting: Family Medicine

## 2012-11-13 ENCOUNTER — Encounter: Payer: Self-pay | Admitting: Family Medicine

## 2012-11-13 VITALS — BP 98/68 | HR 104 | Temp 99.7°F | Ht 65.0 in | Wt 207.2 lb

## 2012-11-13 DIAGNOSIS — J189 Pneumonia, unspecified organism: Secondary | ICD-10-CM

## 2012-11-13 MED ORDER — AZITHROMYCIN 250 MG PO TABS: ORAL_TABLET | ORAL | Status: DC

## 2012-11-13 MED ORDER — GUAIFENESIN-CODEINE 100-10 MG/5ML PO SYRP: 5.0000 mL | ORAL_SOLUTION | Freq: Four times a day (QID) | ORAL | Status: DC | PRN

## 2012-11-13 NOTE — Assessment & Plan Note (Addendum)
In pt who works in health care center with fever/ cough  Rales heard on R cxr today - bibasilar opacification-worse on R / some patchiness zpack px  Robitussin with codeine prn  Will update if worse or not imp and short term f/u planned

## 2012-11-13 NOTE — Progress Notes (Signed)
Subjective:    Patient ID: Ariel Compton, female    DOB: 06/01/1956, 56 y.o.   MRN: [id number]  HPI 2 illnesses in the last month  First time - had uri symptoms - and was given quinolone a month ago    Started to get chills and fever and malaise on Friday  Felt awful Did get a flu vaccine this year   Works in the ED- exp to everything   99.7 temp now  Sweats and chills at home- ? How high , but it calmed down last night   Some cough - ? Left over from her last illness- is about the same Not overly productive Is wheezing some  Not using any inhalers right now --had advair   Stuffy and runny nose and no st Some headaches Feels like sinus pain - around and above eyes  Some purulent nasal drainage   Patient Active Problem List  Diagnosis  . HYPERLIPIDEMIA  . OBESITY  . DEPRESSION  . ALLERGIC RHINITIS  . ASTHMA, PERSISTENT, MILD  . BLADDER PROLAPSE  . MENOPAUSE, SURGICAL  . DUPUYTREN'S CONTRACTURE  . Back pain  . Postmenopausal HRT (hormone replacement therapy)  . Neck pain on right side  . Other screening mammogram  . Routine general medical examination at a health care facility   Past Medical History  Diagnosis Date  . Allergic rhinitis   . Depression   . Asthma     mild  . Plantar fasciitis   . Unspecified disorder of bladder   . Contracture of palmar fascia   . Mixed hyperlipidemia   . Symptomatic states associated with artificial menopause   . Obesity, unspecified    Past Surgical History  Procedure Date  . Vaginal hysterectomy 2008    Enterocele repair   History  Substance Use Topics  . Smoking status: Never Smoker   . Smokeless tobacco: Not on file  . Alcohol Use: No   Family History  Problem Relation Age of Onset  . Peripheral vascular disease Father     Smoker  . Prostate cancer Father   . Heart failure Father   . Other Father     Heart "problems"  . Healthy Mother   . Breast cancer Mother    Allergies  Allergen Reactions  .  Amoxicillin     REACTION: GI UPSET  . Prednisone     Chills, cough   Current Outpatient Prescriptions on File Prior to Visit  Medication Sig Dispense Refill  . estrogens, conjugated, (PREMARIN) 0.45 MG tablet Take 1 tablet (0.45 mg total) by mouth daily.  90 tablet  3  . Multiple Vitamin (MULTIVITAMIN) tablet Take 1 tablet by mouth daily.        . sertraline (ZOLOFT) 100 MG tablet Take 1.5 tablets (150 mg total) by mouth daily.  135 tablet  3        Review of Systems Review of Systems  Constitutional: Negative for  unexpected weight change. pos for fever and fatigue  Eyes: Negative for pain and visual disturbance. ENT pos for nasal congestion and drip/ neg for sinus pain   Respiratory: Negative for shortness of breath.  pos for rib soreness Cardiovascular: Negative for cp or palpitations    Gastrointestinal: Negative for nausea, diarrhea and constipation.  Genitourinary: Negative for urgency and frequency.  Skin: Negative for pallor or rash   Neurological: Negative for weakness, light-headedness, numbness and headaches.  Hematological: Negative for adenopathy. Does not bruise/bleed easily.  Psychiatric/Behavioral: Negative for dysphoric  mood. The patient is not nervous/anxious.         Objective:   Physical Exam  Constitutional: She appears well-developed and well-nourished. No distress.       Seems fatigued   HENT:  Head: Normocephalic and atraumatic.  Right Ear: External ear normal.  Left Ear: External ear normal.  Mouth/Throat: Oropharynx is clear and moist. No oropharyngeal exudate.       Nares are injected and congested   No sinus tenderness   Eyes: Conjunctivae normal and EOM are normal. Pupils are equal, round, and reactive to light. Right eye exhibits no discharge. Left eye exhibits no discharge.  Neck: Normal range of motion. Neck supple.  Cardiovascular: Normal rate, regular rhythm, normal heart sounds and intact distal pulses.  Exam reveals no gallop.    Pulmonary/Chest: Effort normal. No respiratory distress. She has no wheezes. She has rales. She exhibits no tenderness.       Scant rales heard at R base Some isolated rhonchi Good air exch Not sob  Abdominal: Soft. Bowel sounds are normal. There is no tenderness.  Musculoskeletal: She exhibits no edema.  Lymphadenopathy:    She has no cervical adenopathy.  Neurological: She is alert. She has normal reflexes.  Skin: Skin is warm and dry. No rash noted.  Psychiatric: She has a normal mood and affect.          Assessment & Plan:

## 2012-11-13 NOTE — Patient Instructions (Addendum)
Chest xray today  Drink lots of fluids Use your advair Tylenol for fever if needed  Take zithromax as directed  Cough medicine with care- it will sedate

## 2012-11-19 ENCOUNTER — Encounter: Payer: PRIVATE HEALTH INSURANCE | Admitting: Family Medicine

## 2012-11-19 ENCOUNTER — Other Ambulatory Visit: Payer: Self-pay

## 2012-11-19 MED ORDER — CYCLOBENZAPRINE HCL 5 MG PO TABS: 5.0000 mg | ORAL_TABLET | Freq: Three times a day (TID) | ORAL | Status: DC | PRN

## 2012-11-19 NOTE — Telephone Encounter (Signed)
Pt saw Dr Milinda Antis for pneumonia and pinched nerve; pt request refill cyclobenzaprine to [facility]. Until pt sees PT 11/26/12.Please advise.

## 2012-11-19 NOTE — Telephone Encounter (Signed)
Patient Information:  Caller Name: Kyung  Phone: [phone]  Patient: Ariel Compton, Ariel Compton  Gender: Female  DOB: 1956-02-24  Age: 56 Years  PCP: [name] [employer] [facility])  Pregnant: No   Symptoms  Reason For Call & Symptoms: "Pinched nerve" in right shoulder with pain in arm; unable to wear bra due to pain.  Current pain rated at 6 of 10.  Requests "stronger muscle relaxer to help me sleep."  Emergent symptoms ruled out.  Advised see in 24 hours per nursing judgment.  Reviewed Health History In EMR: Yes  Reviewed Medications In EMR: Yes  Reviewed Allergies In EMR: Yes  Reviewed Surgeries / Procedures: Yes  Date of Onset of Symptoms: 11/06/2012  Treatments Tried: Skelaxin from previous episode, Prednisone pack, Antibiotics for Pneumonia; stopped  Prednisone due to Pneumonia.  Treatments Tried Worked: No OB:  LMP: Unknown  Guideline(s) Used:  Back Pain  Disposition Per Guideline:   See Within 2 Weeks in Office  Reason For Disposition Reached:   Back pain persists > 2 weeks  Advice Given:  N/A  Office Follow Up:  Does the office need to follow up with this patient?: Yes  Instructions For The Office: Patient first agreed to appointment, then requested note to PCP requesting muscle relaxer.  Uses [facility] on [address] - confirmed information in [place].  Patient requests callback either way at [phone].  Thank you.    RN Note:  Caller agreed to see provider, then requested refill on medication to last until 11/26/12 when she sees Physical Therapy.

## 2012-11-19 NOTE — Telephone Encounter (Signed)
Please refil 1 mo of the flexeril, thanks

## 2012-11-26 ENCOUNTER — Encounter: Payer: Self-pay | Admitting: Family Medicine

## 2012-12-12 ENCOUNTER — Encounter: Payer: Self-pay | Admitting: Family Medicine

## 2012-12-18 ENCOUNTER — Encounter: Payer: Self-pay | Admitting: Family Medicine

## 2012-12-18 ENCOUNTER — Encounter: Payer: Self-pay | Admitting: *Deleted

## 2012-12-18 ENCOUNTER — Ambulatory Visit: Payer: Self-pay | Admitting: Family Medicine

## 2013-01-15 ENCOUNTER — Other Ambulatory Visit (INDEPENDENT_AMBULATORY_CARE_PROVIDER_SITE_OTHER): Payer: 59

## 2013-01-15 DIAGNOSIS — E782 Mixed hyperlipidemia: Secondary | ICD-10-CM

## 2013-01-15 DIAGNOSIS — Z0000 Encounter for general adult medical examination without abnormal findings: Secondary | ICD-10-CM

## 2013-01-15 LAB — CBC WITH DIFFERENTIAL/PLATELET
Basophils Relative: 0.6 % (ref 0.0–3.0)
Eosinophils Relative: 4.4 % (ref 0.0–5.0)
HCT: 39.3 % (ref 36.0–46.0)
Lymphs Abs: 2.1 10*3/uL (ref 0.7–4.0)
MCHC: 33.7 g/dL (ref 30.0–36.0)
MCV: 85.9 fl (ref 78.0–100.0)
Monocytes Absolute: 0.3 10*3/uL (ref 0.1–1.0)
RBC: 4.58 Mil/uL (ref 3.87–5.11)
WBC: 6.3 10*3/uL (ref 4.5–10.5)

## 2013-01-15 LAB — COMPREHENSIVE METABOLIC PANEL
Alkaline Phosphatase: 44 U/L (ref 39–117)
BUN: 14 mg/dL (ref 6–23)
Creatinine, Ser: 0.6 mg/dL (ref 0.4–1.2)
Glucose, Bld: 99 mg/dL (ref 70–99)
Total Bilirubin: 0.4 mg/dL (ref 0.3–1.2)

## 2013-01-15 LAB — LIPID PANEL
Cholesterol: 214 mg/dL — ABNORMAL HIGH (ref 0–200)
HDL: 62.2 mg/dL (ref >39.00)
Total CHOL/HDL Ratio: 3
Triglycerides: 159 mg/dL — ABNORMAL HIGH (ref 0.0–149.0)
VLDL: 31.8 mg/dL (ref 0.0–40.0)

## 2013-01-22 ENCOUNTER — Encounter: Payer: Self-pay | Admitting: Family Medicine

## 2013-01-22 ENCOUNTER — Ambulatory Visit (INDEPENDENT_AMBULATORY_CARE_PROVIDER_SITE_OTHER): Payer: 59 | Admitting: Family Medicine

## 2013-01-22 VITALS — BP 108/74 | HR 84 | Temp 98.4°F | Ht 63.5 in | Wt 202.5 lb

## 2013-01-22 DIAGNOSIS — E669 Obesity, unspecified: Secondary | ICD-10-CM

## 2013-01-22 DIAGNOSIS — Z0000 Encounter for general adult medical examination without abnormal findings: Secondary | ICD-10-CM

## 2013-01-22 DIAGNOSIS — E782 Mixed hyperlipidemia: Secondary | ICD-10-CM

## 2013-01-22 DIAGNOSIS — Z1211 Encounter for screening for malignant neoplasm of colon: Secondary | ICD-10-CM | POA: Insufficient documentation

## 2013-01-22 DIAGNOSIS — Z79890 Hormone replacement therapy (postmenopausal): Secondary | ICD-10-CM

## 2013-01-22 DIAGNOSIS — Z23 Encounter for immunization: Secondary | ICD-10-CM

## 2013-01-22 NOTE — Assessment & Plan Note (Signed)
Rev pros/cons /risks with fam hx of breast cancer  Pt chooses to continue for now since pros outweigh cons

## 2013-01-22 NOTE — Patient Instructions (Addendum)
Pneumonia vaccine today Please do IFOB card for colon cancer screening  Work on healthy eating and exercise- take care of yourself

## 2013-01-22 NOTE — Progress Notes (Signed)
Subjective:    Patient ID: Ariel Compton, female    DOB: 1956-02-13, 57 y.o.   MRN: [id number]  HPI Here for health maintenance exam and to review chronic medical problems    Is doing pretty well overall  Is on augmentin for sinus infection right now - and it is working   [employer] is down 5 lb Made the hospital bigger and she is walking more/ also takes the stairs  No sweets  Has weakness for salsa and chips  Obese  Hysterectomy in past  It was for ? Endometriosis  Does not see gyn   mammo 1/14 Self exam- nl lumps or changes  Mother had breast cancer   Flu vaccine- had that in the fall  Pneumovax -needs one of those - has had pneumonia in the past  Colon cancer screen-no family hx so she is not interested  Will do ifob   Hyperlipidemia Lab Results  Component Value Date   CHOL 214* 01/15/2013   CHOL 198 01/21/2008   CHOL 238* 10/25/2007   Lab Results  Component Value Date   HDL 62.20 01/15/2013   HDL 52.9 01/21/2008   HDL 53.8 10/25/2007   Lab Results  Component Value Date   LDLCALC 115* 01/21/2008   Lab Results  Component Value Date   TRIG 159.0* 01/15/2013   TRIG 149 01/21/2008   TRIG 325* 10/25/2007   Lab Results  Component Value Date   CHOLHDL 3 01/15/2013   CHOLHDL 3.7 CALC 01/21/2008   CHOLHDL 4.4 CALC 10/25/2007   Lab Results  Component Value Date   LDLDIRECT 114.0 01/15/2013   LDLDIRECT 121.4 10/25/2007   LDLDIRECT 121.4 10/23/2007   not too bad - the exercise is paying off   Mood- has been ok - is stable  (could not wean zoloft )   HRT-still on hormones  Aware of risks -wants to continue   Patient Active Problem List  Diagnosis  . HYPERLIPIDEMIA  . OBESITY  . DEPRESSION  . ALLERGIC RHINITIS  . ASTHMA, PERSISTENT, MILD  . BLADDER PROLAPSE  . MENOPAUSE, SURGICAL  . DUPUYTREN'S CONTRACTURE  . Postmenopausal HRT (hormone replacement therapy)  . Other screening mammogram  . Routine general medical examination at a health care facility   Past Medical  History  Diagnosis Date  . Allergic rhinitis   . Depression   . Asthma     mild  . Plantar fasciitis   . Unspecified disorder of bladder   . Contracture of palmar fascia   . Mixed hyperlipidemia   . Symptomatic states associated with artificial menopause   . Obesity, unspecified    Past Surgical History  Procedure Laterality Date  . Vaginal hysterectomy  2008    Enterocele repair   History  Substance Use Topics  . Smoking status: Never Smoker   . Smokeless tobacco: Not on file  . Alcohol Use: No   Family History  Problem Relation Age of Onset  . Peripheral vascular disease Father     Smoker  . Prostate cancer Father   . Heart failure Father   . Other Father     Heart "problems"  . Healthy Mother   . Breast cancer Mother    Allergies  Allergen Reactions  . Amoxicillin     REACTION: GI UPSET  . Prednisone     Chills, cough   Current Outpatient Prescriptions on File Prior to Visit  Medication Sig Dispense Refill  . estrogens, conjugated, (PREMARIN) 0.45 MG tablet Take 1 tablet (  0.45 mg total) by mouth daily.  90 tablet  3  . Multiple Vitamin (MULTIVITAMIN) tablet Take 1 tablet by mouth daily.        . sertraline (ZOLOFT) 100 MG tablet Take 1.5 tablets (150 mg total) by mouth daily.  135 tablet  3  . cyclobenzaprine (FLEXERIL) 5 MG tablet Take 1 tablet (5 mg total) by mouth every 8 (eight) hours as needed for muscle spasms (sedation caution).  60 tablet  0   No current facility-administered medications on file prior to visit.        Review of Systems Review of Systems  Constitutional: Negative for fever, appetite change, fatigue and unexpected weight change.  Eyes: Negative for pain and visual disturbance.  Respiratory: Negative for cough and shortness of breath.   Cardiovascular: Negative for cp or palpitations    Gastrointestinal: Negative for nausea, diarrhea and constipation.  Genitourinary: Negative for urgency and frequency.  Skin: Negative for pallor  or rash   Neurological: Negative for weakness, light-headedness, numbness and headaches.  Hematological: Negative for adenopathy. Does not bruise/bleed easily.  Psychiatric/Behavioral: Negative for dysphoric mood. The patient is not nervous/anxious.         Objective:   Physical Exam  Constitutional: She appears well-developed and well-nourished. No distress.  obese and well appearing   HENT:  Head: Normocephalic and atraumatic.  Right Ear: External ear normal.  Left Ear: External ear normal.  Nose: Nose normal.  Mouth/Throat: Oropharynx is clear and moist. No oropharyngeal exudate.  Eyes: Conjunctivae and EOM are normal. Pupils are equal, round, and reactive to light. Right eye exhibits no discharge. Left eye exhibits no discharge. No scleral icterus.  Neck: Normal range of motion. Neck supple. No JVD present. No thyromegaly present.  Cardiovascular: Normal rate, regular rhythm, normal heart sounds and intact distal pulses.  Exam reveals no gallop.   Pulmonary/Chest: Effort normal and breath sounds normal. No respiratory distress. She has no wheezes.  Abdominal: Soft. Bowel sounds are normal. She exhibits no distension and no mass. There is no tenderness.  Genitourinary: No breast swelling, tenderness, discharge or bleeding.  Breast exam: No mass, nodules, thickening, tenderness, bulging, retraction, inflamation, nipple discharge or skin changes noted.  No axillary or clavicular LA.  Chaperoned exam.    Musculoskeletal: She exhibits no edema and no tenderness.  Lymphadenopathy:    She has no cervical adenopathy.  Neurological: She is alert. She has normal reflexes. No cranial nerve deficit. She exhibits normal muscle tone. Coordination normal.  Skin: Skin is warm and dry. No rash noted. No erythema. No pallor.  Psychiatric: She has a normal mood and affect.          Assessment & Plan:

## 2013-01-22 NOTE — Assessment & Plan Note (Signed)
Reviewed health habits including diet and exercise and skin cancer prevention Also reviewed health mt list, fam hx and immunizations  Wellness labs reviewed Pneumovax today

## 2013-01-22 NOTE — Assessment & Plan Note (Signed)
Pt declines colonoscopy Will do IFOB card

## 2013-01-22 NOTE — Assessment & Plan Note (Signed)
Disc goals for lipids and reasons to control them Rev labs with pt Rev low sat fat diet in detail   

## 2013-01-22 NOTE — Assessment & Plan Note (Signed)
Discussed how this problem influences overall health and the risks it imposes  Reviewed plan for weight loss with lower calorie diet (via better food choices and also portion control or program like weight watchers) and exercise building up to or more than 30 minutes 5 days per week including some aerobic activity    

## 2013-05-03 ENCOUNTER — Other Ambulatory Visit: Payer: Self-pay

## 2013-05-03 MED ORDER — CYCLOBENZAPRINE HCL 5 MG PO TABS: 5.0000 mg | ORAL_TABLET | Freq: Three times a day (TID) | ORAL | Status: DC | PRN

## 2013-05-03 NOTE — Telephone Encounter (Signed)
Rx sent to pharmacy and pt notified  ?

## 2013-05-03 NOTE — Telephone Encounter (Signed)
Can have 30 pills with no refils-thanks

## 2013-05-03 NOTE — Telephone Encounter (Signed)
Pt left v/m requesting at least # 10 of Cyclobenzaprine to [facility]. Pt said she pulled back muscle out again and pt is at work and cannot come in until next week.Please advise. pt request call back.

## 2013-07-16 ENCOUNTER — Other Ambulatory Visit: Payer: Self-pay

## 2013-07-16 MED ORDER — SERTRALINE HCL 100 MG PO TABS: 150.0000 mg | ORAL_TABLET | Freq: Every day | ORAL | Status: DC

## 2013-07-16 MED ORDER — ESTROGENS CONJUGATED 0.45 MG PO TABS: 0.4500 mg | ORAL_TABLET | Freq: Every day | ORAL | Status: DC

## 2013-07-16 NOTE — Telephone Encounter (Signed)
Pt request refill premarin and sertraline to [facility] emp pharmacy; advised done.

## 2013-10-22 ENCOUNTER — Ambulatory Visit (INDEPENDENT_AMBULATORY_CARE_PROVIDER_SITE_OTHER): Payer: BC Managed Care – PPO | Admitting: Family Medicine

## 2013-10-22 ENCOUNTER — Encounter: Payer: Self-pay | Admitting: Family Medicine

## 2013-10-22 VITALS — BP 122/70 | HR 88 | Temp 98.2°F | Ht 63.5 in | Wt 197.0 lb

## 2013-10-22 DIAGNOSIS — F329 Major depressive disorder, single episode, unspecified: Secondary | ICD-10-CM

## 2013-10-22 DIAGNOSIS — E782 Mixed hyperlipidemia: Secondary | ICD-10-CM

## 2013-10-22 DIAGNOSIS — E669 Obesity, unspecified: Secondary | ICD-10-CM

## 2013-10-22 DIAGNOSIS — F3289 Other specified depressive episodes: Secondary | ICD-10-CM

## 2013-10-22 MED ORDER — FLUTICASONE-SALMETEROL 100-50 MCG/DOSE IN AEPB: 1.0000 | INHALATION_SPRAY | RESPIRATORY_TRACT | Status: DC | PRN

## 2013-10-22 MED ORDER — CYCLOBENZAPRINE HCL 5 MG PO TABS: 5.0000 mg | ORAL_TABLET | Freq: Three times a day (TID) | ORAL | Status: DC | PRN

## 2013-10-22 MED ORDER — ESTROGENS CONJUGATED 0.45 MG PO TABS: 0.4500 mg | ORAL_TABLET | Freq: Every day | ORAL | Status: DC

## 2013-10-22 MED ORDER — SERTRALINE HCL 100 MG PO TABS: 150.0000 mg | ORAL_TABLET | Freq: Every day | ORAL | Status: DC

## 2013-10-22 NOTE — Progress Notes (Signed)
Pre-visit discussion using our clinic review tool. No additional management support is needed unless otherwise documented below in the visit note.  

## 2013-10-22 NOTE — Patient Instructions (Signed)
Medicines refilled Keep working hard on healthy diet and exercise  Schedule PE with labs prior in march or April

## 2013-10-22 NOTE — Progress Notes (Signed)
Subjective:    Patient ID: Ariel Compton, female    DOB: 1956-10-23, 57 y.o.   MRN: [id number]  HPI Here for f/u of chronic medical problems  She thinks she is doing pretty well   Wt is down 5 lb with bmi of 34 She wants to loose more  She gave up sugar - (added) - and that is helpful   Walking for exercise   Had her flu vaccine   Mammogram 1/14  Td 9/10  Depression - doing well with her current medicine - no major ups or downs Stress level remains the same  Overall doing ok    Hyperlipidemia Lab Results  Component Value Date   CHOL 214* 01/15/2013   HDL 62.20 01/15/2013   LDLCALC 115* 01/21/2008   LDLDIRECT 114.0 01/15/2013   TRIG 159.0* 01/15/2013   CHOLHDL 3 01/15/2013    Asthma -has been fair - overall occ headache during allergy season    Patient Active Problem List   Diagnosis Date Noted  . Colon cancer screening 01/22/2013  . Routine general medical examination at a health care facility 11/11/2012  . Other screening mammogram 07/23/2012  . Postmenopausal HRT (hormone replacement therapy) 07/07/2011  . DUPUYTREN'S CONTRACTURE 03/16/2010  . MENOPAUSE, SURGICAL 09/04/2009  . ASTHMA, PERSISTENT, MILD 08/11/2008  . HYPERLIPIDEMIA 12/04/2007  . DEPRESSION 12/04/2007  . ALLERGIC RHINITIS 12/04/2007  . BLADDER PROLAPSE 12/04/2007  . OBESITY 10/22/2007   Past Medical History  Diagnosis Date  . Allergic rhinitis   . Depression   . Asthma     mild  . Plantar fasciitis   . Unspecified disorder of bladder   . Contracture of palmar fascia   . Mixed hyperlipidemia   . Symptomatic states associated with artificial menopause   . Obesity, unspecified    Past Surgical History  Procedure Laterality Date  . Vaginal hysterectomy  2008    Enterocele repair   History  Substance Use Topics  . Smoking status: Never Smoker   . Smokeless tobacco: Not on file  . Alcohol Use: No   Family History  Problem Relation Age of Onset  . Peripheral vascular disease Father    Smoker  . Prostate cancer Father   . Heart failure Father   . Other Father     Heart "problems"  . Healthy Mother   . Breast cancer Mother    Allergies  Allergen Reactions  . Amoxicillin     REACTION: GI UPSET  . Prednisone     Chills, cough   Current Outpatient Prescriptions on File Prior to Visit  Medication Sig Dispense Refill  . Multiple Vitamin (MULTIVITAMIN) tablet Take 1 tablet by mouth daily.         No current facility-administered medications on file prior to visit.       Review of Systems Review of Systems  Constitutional: Negative for fever, appetite change, fatigue and unexpected weight change.  Eyes: Negative for pain and visual disturbance.  Respiratory: Negative for cough and shortness of breath.  occ scant wheeze  ENt pos for rhinorrhea occ for allergies  Cardiovascular: Negative for cp or palpitations    Gastrointestinal: Negative for nausea, diarrhea and constipation.  Genitourinary: Negative for urgency and frequency.  Skin: Negative for pallor or rash   Neurological: Negative for weakness, light-headedness, numbness  Hematological: Negative for adenopathy. Does not bruise/bleed easily.  Psychiatric/Behavioral: Negative for dysphoric mood. The patient is not nervous/anxious.  (well controlled)       Objective:   Physical  Exam  Constitutional: She appears well-developed and well-nourished. No distress.  obese and well appearing   HENT:  Head: Normocephalic and atraumatic.  Mouth/Throat: Oropharynx is clear and moist.  Eyes: Conjunctivae and EOM are normal. Pupils are equal, round, and reactive to light. No scleral icterus.  Neck: Normal range of motion. Neck supple. No JVD present. Carotid bruit is not present. No thyromegaly present.  Cardiovascular: Normal rate, regular rhythm, normal heart sounds and intact distal pulses.  Exam reveals no gallop.   Pulmonary/Chest: Effort normal and breath sounds normal. No respiratory distress. She has no  wheezes. She has no rales.  Musculoskeletal: She exhibits no edema.  Lymphadenopathy:    She has no cervical adenopathy.  Neurological: She is alert. She has normal reflexes. No cranial nerve deficit. She exhibits normal muscle tone. Coordination normal.  Skin: Skin is warm and dry. No rash noted. No erythema. No pallor.  Psychiatric: She has a normal mood and affect. Her mood appears not anxious. Her affect is not blunt and not labile. She does not exhibit a depressed mood.          Assessment & Plan:

## 2013-10-23 NOTE — Assessment & Plan Note (Signed)
Starting to eat better / lower glycemic diet  Enc to keep it up and add exercise  Discussed how this problem influences overall health and the risks it imposes  Reviewed plan for weight loss with lower calorie diet (via better food choices and also portion control or program like weight watchers) and exercise building up to or more than 30 minutes 5 days per week including some aerobic activity

## 2013-10-23 NOTE — Assessment & Plan Note (Signed)
Stable  On zoloft without problems Refilled

## 2013-10-23 NOTE — Assessment & Plan Note (Signed)
Disc goals for lipids and reasons to control them Rev labs with pt Rev low sat fat diet in detail   

## 2013-12-09 ENCOUNTER — Telehealth: Payer: Self-pay | Admitting: *Deleted

## 2013-12-09 MED ORDER — METAXALONE 800 MG PO TABS: 800.0000 mg | ORAL_TABLET | Freq: Three times a day (TID) | ORAL | Status: DC | PRN

## 2013-12-09 NOTE — Telephone Encounter (Signed)
Will send skelaxin electronically She can make her own mammogram appt

## 2013-12-09 NOTE — Telephone Encounter (Signed)
Pt left voicemail, pt said you prescribed her flexeril for a shoulder problem. The flexeril is to strong and she can't work while she is taking Rx, pt has taken skelaxin in the past with no problem and would like a Rx sent in for skelaxin (uses [employer] [facility] [address] please advise  Pt also said needs referral for mammogram, I will give her [facility] phone # when I call her back

## 2013-12-10 NOTE — Telephone Encounter (Signed)
Left voicemail letting pt know Rx sent to pharmacy and left phone # of [facility] so pt could schedule her own mammogram

## 2014-01-07 ENCOUNTER — Ambulatory Visit: Payer: Self-pay | Admitting: Family Medicine

## 2014-01-08 ENCOUNTER — Encounter: Payer: Self-pay | Admitting: Family Medicine

## 2014-01-10 ENCOUNTER — Encounter: Payer: Self-pay | Admitting: *Deleted

## 2014-02-26 ENCOUNTER — Telehealth: Payer: Self-pay | Admitting: Family Medicine

## 2014-02-26 DIAGNOSIS — Z0000 Encounter for general adult medical examination without abnormal findings: Secondary | ICD-10-CM

## 2014-02-26 NOTE — Telephone Encounter (Signed)
Message copied by Judy PimpleWER, [name] on Wed Feb 26, 2014 10:07 PM ------      Message from: Alvina ChouWALSH, TERRI J      Created: Fri Feb 21, 2014 11:30 AM      Regarding: Lab orders for Friday, 3.20.15       Patient is scheduled for CPX labs, please order future labs, Thanks , Terri       ------

## 2014-02-28 ENCOUNTER — Other Ambulatory Visit (INDEPENDENT_AMBULATORY_CARE_PROVIDER_SITE_OTHER): Payer: BC Managed Care – PPO

## 2014-02-28 DIAGNOSIS — Z0000 Encounter for general adult medical examination without abnormal findings: Secondary | ICD-10-CM

## 2014-02-28 LAB — CBC WITH DIFFERENTIAL/PLATELET
BASOS ABS: 0 10*3/uL (ref 0.0–0.1)
Basophils Relative: 0.7 % (ref 0.0–3.0)
EOS PCT: 3.6 % (ref 0.0–5.0)
Eosinophils Absolute: 0.2 10*3/uL (ref 0.0–0.7)
HEMATOCRIT: 39.5 % (ref 36.0–46.0)
HEMOGLOBIN: 13.2 g/dL (ref 12.0–15.0)
LYMPHS ABS: 2.3 10*3/uL (ref 0.7–4.0)
LYMPHS PCT: 33.4 % (ref 12.0–46.0)
MCHC: 33.4 g/dL (ref 30.0–36.0)
MCV: 89.1 fl (ref 78.0–100.0)
MONOS PCT: 5.9 % (ref 3.0–12.0)
Monocytes Absolute: 0.4 10*3/uL (ref 0.1–1.0)
NEUTROS ABS: 3.8 10*3/uL (ref 1.4–7.7)
Neutrophils Relative %: 56.4 % (ref 43.0–77.0)
Platelets: 367 10*3/uL (ref 150.0–400.0)
RBC: 4.43 Mil/uL (ref 3.87–5.11)
RDW: 13.6 % (ref 11.5–14.6)
WBC: 6.8 10*3/uL (ref 4.5–10.5)

## 2014-02-28 LAB — COMPREHENSIVE METABOLIC PANEL
ALT: 9 U/L (ref 0–35)
AST: 15 U/L (ref 0–37)
Albumin: 3.9 g/dL (ref 3.5–5.2)
Alkaline Phosphatase: 47 U/L (ref 39–117)
BILIRUBIN TOTAL: 0.3 mg/dL (ref 0.3–1.2)
BUN: 14 mg/dL (ref 6–23)
CALCIUM: 9.3 mg/dL (ref 8.4–10.5)
CHLORIDE: 105 meq/L (ref 96–112)
CO2: 30 meq/L (ref 19–32)
CREATININE: 0.7 mg/dL (ref 0.4–1.2)
GFR: 100.12 mL/min (ref >60.00)
GLUCOSE: 78 mg/dL (ref 70–99)
Potassium: 3.7 mEq/L (ref 3.5–5.1)
SODIUM: 140 meq/L (ref 135–145)
TOTAL PROTEIN: 7.4 g/dL (ref 6.0–8.3)

## 2014-02-28 LAB — LIPID PANEL
CHOLESTEROL: 258 mg/dL — AB (ref 0–200)
HDL: 72.5 mg/dL (ref >39.00)
LDL Cholesterol: 151 mg/dL — ABNORMAL HIGH (ref 0–99)
Total CHOL/HDL Ratio: 4
Triglycerides: 175 mg/dL — ABNORMAL HIGH (ref 0.0–149.0)
VLDL: 35 mg/dL (ref 0.0–40.0)

## 2014-02-28 LAB — TSH: TSH: 0.78 u[IU]/mL (ref 0.35–5.50)

## 2014-03-07 ENCOUNTER — Encounter: Payer: BC Managed Care – PPO | Admitting: Family Medicine

## 2014-03-31 ENCOUNTER — Ambulatory Visit: Payer: BC Managed Care – PPO | Admitting: Family Medicine

## 2014-03-31 DIAGNOSIS — Z0289 Encounter for other administrative examinations: Secondary | ICD-10-CM

## 2014-04-09 ENCOUNTER — Ambulatory Visit (INDEPENDENT_AMBULATORY_CARE_PROVIDER_SITE_OTHER): Payer: BC Managed Care – PPO | Admitting: Family Medicine

## 2014-04-09 ENCOUNTER — Encounter: Payer: Self-pay | Admitting: Family Medicine

## 2014-04-09 VITALS — BP 148/88 | HR 70 | Temp 97.8°F | Ht 63.25 in | Wt 196.8 lb

## 2014-04-09 DIAGNOSIS — J019 Acute sinusitis, unspecified: Secondary | ICD-10-CM

## 2014-04-09 MED ORDER — LEVOFLOXACIN 500 MG PO TABS: 500.0000 mg | ORAL_TABLET | Freq: Every day | ORAL | Status: DC

## 2014-04-09 NOTE — Progress Notes (Signed)
Pre visit review using our clinic review tool, if applicable. No additional management support is needed unless otherwise documented below in the visit note. 

## 2014-04-09 NOTE — Patient Instructions (Signed)
Drink lots of fluids  Try to get some rests  Use nasal saline spray or a netti pot to irrigate sinuses  Zyrtec is ok  Take the levaquin as directed

## 2014-04-09 NOTE — Progress Notes (Signed)
Subjective:    Patient ID: Ariel NeighborsAlana A Compton, female    DOB: 04/26/1956, 58 y.o.   MRN: [id number]  HPI Here with symptoms of a sinus infection  Having allergy issues  Now headaches and ear pain and teeth pain -wakes her up in the middle of the night  Zyrtec and tylenol and ibuprofen  Runs a fever in between doses   Also lost her voice   Coughs on and off  Uses both of her inhalers for asthma   Patient Active Problem List   Diagnosis Date Noted  . Colon cancer screening 01/22/2013  . Routine general medical examination at a health care facility 11/11/2012  . Other screening mammogram 07/23/2012  . Postmenopausal HRT (hormone replacement therapy) 07/07/2011  . DUPUYTREN'S CONTRACTURE 03/16/2010  . MENOPAUSE, SURGICAL 09/04/2009  . ASTHMA, PERSISTENT, MILD 08/11/2008  . HYPERLIPIDEMIA 12/04/2007  . DEPRESSION 12/04/2007  . ALLERGIC RHINITIS 12/04/2007  . BLADDER PROLAPSE 12/04/2007  . OBESITY 10/22/2007   Past Medical History  Diagnosis Date  . Allergic rhinitis   . Depression   . Asthma     mild  . Plantar fasciitis   . Unspecified disorder of bladder   . Contracture of palmar fascia   . Mixed hyperlipidemia   . Symptomatic states associated with artificial menopause   . Obesity, unspecified    Past Surgical History  Procedure Laterality Date  . Vaginal hysterectomy  2008    Enterocele repair   History  Substance Use Topics  . Smoking status: Never Smoker   . Smokeless tobacco: Not on file  . Alcohol Use: No   Family History  Problem Relation Age of Onset  . Peripheral vascular disease Father     Smoker  . Prostate cancer Father   . Heart failure Father   . Other Father     Heart "problems"  . Healthy Mother   . Breast cancer Mother    Allergies  Allergen Reactions  . Amoxicillin     REACTION: GI UPSET  . Prednisone     Chills, cough   Current Outpatient Prescriptions on File Prior to Visit  Medication Sig Dispense Refill  . estrogens,  conjugated, (PREMARIN) 0.45 MG tablet Take 1 tablet (0.45 mg total) by mouth daily.  90 tablet  3  . Fluticasone-Salmeterol (ADVAIR DISKUS) 100-50 MCG/DOSE AEPB Inhale 1 puff into the lungs as needed.  60 each  11  . metaxalone (SKELAXIN) 800 MG tablet Take 1 tablet (800 mg total) by mouth 3 (three) times daily as needed for muscle spasms.  30 tablet  1  . Multiple Vitamin (MULTIVITAMIN) tablet Take 1 tablet by mouth daily.        . sertraline (ZOLOFT) 100 MG tablet Take 1.5 tablets (150 mg total) by mouth daily.  135 tablet  3   No current facility-administered medications on file prior to visit.      Review of Systems Review of Systems  Constitutional: Negative for  appetite change,  and unexpected weight change.  ENT pos for congestion and facial pain and drip Eyes: Negative for pain and visual disturbance.  Respiratory: Negative for cough and shortness of breath.   Cardiovascular: Negative for cp or palpitations    Gastrointestinal: Negative for nausea, diarrhea and constipation.  Genitourinary: Negative for urgency and frequency.  Skin: Negative for pallor or rash   Neurological: Negative for weakness, light-headedness, numbness and headaches.  Hematological: Negative for adenopathy. Does not bruise/bleed easily.  Psychiatric/Behavioral: Negative for dysphoric mood. The  patient is not nervous/anxious.         Objective:   Physical Exam  Constitutional: She appears well-developed and well-nourished. No distress.  HENT:  Head: Normocephalic and atraumatic.  Right Ear: External ear normal.  Left Ear: External ear normal.  Mouth/Throat: Oropharynx is clear and moist. No oropharyngeal exudate.  Nares are injected and congested  L frontal and maxillary sinus tenderness Purulent post nasal drip     Eyes: Conjunctivae and EOM are normal. Pupils are equal, round, and reactive to light. Right eye exhibits no discharge. Left eye exhibits no discharge.  Neck: Normal range of motion.  Neck supple.  Cardiovascular: Normal rate and regular rhythm.   Pulmonary/Chest: Effort normal and breath sounds normal. No respiratory distress. She has no wheezes. She has no rales.  Musculoskeletal: She exhibits no edema.  Lymphadenopathy:    She has no cervical adenopathy.  Neurological: She is alert. No cranial nerve deficit.  Skin: Skin is warm and dry. No rash noted.  Psychiatric: She has a normal mood and affect.          Assessment & Plan:

## 2014-04-10 NOTE — Assessment & Plan Note (Signed)
Cover with levaquin  Disc symptomatic care - see instructions on AVS  Update if not starting to improve in a week or if worsening   

## 2014-04-11 ENCOUNTER — Other Ambulatory Visit: Payer: Self-pay | Admitting: *Deleted

## 2014-04-11 MED ORDER — METAXALONE 800 MG PO TABS: 800.0000 mg | ORAL_TABLET | Freq: Three times a day (TID) | ORAL | Status: DC | PRN

## 2014-04-11 NOTE — Telephone Encounter (Signed)
Will refill electronically  

## 2014-04-11 NOTE — Telephone Encounter (Signed)
Last filled 01/12/14, Next appt is CPX on 07/02/14. Ok to fill?

## 2014-04-17 ENCOUNTER — Telehealth: Payer: Self-pay

## 2014-04-17 NOTE — Telephone Encounter (Signed)
Left voicemail requesting pt to call office back 

## 2014-04-17 NOTE — Telephone Encounter (Signed)
Pt left v/m; pt was seen on 04/09/14; pt has been taking Levaquin for sinus infection; symptoms that pt had on 04/09/14 have not improved and [facility] [address] or does pt need to be rechecked? Pt request cb.

## 2014-04-17 NOTE — Telephone Encounter (Signed)
What symptoms is she still having? What color is her nasal d/c? Does she have fever?

## 2014-04-22 NOTE — Telephone Encounter (Signed)
Left 2nd voicemail requesting pt to call and update us on how she is feeling

## 2014-05-10 ENCOUNTER — Other Ambulatory Visit: Payer: Self-pay | Admitting: Family Medicine

## 2014-05-12 NOTE — Telephone Encounter (Signed)
Px written for call in   

## 2014-05-12 NOTE — Telephone Encounter (Signed)
Electronic refill request, please advise  

## 2014-05-12 NOTE — Telephone Encounter (Signed)
Dr. Milinda Antis sent in Rx electronically. Didn't have to call it in

## 2014-05-15 ENCOUNTER — Ambulatory Visit: Payer: BC Managed Care – PPO | Admitting: Family Medicine

## 2014-05-21 ENCOUNTER — Ambulatory Visit: Payer: BC Managed Care – PPO | Admitting: Family Medicine

## 2014-07-02 ENCOUNTER — Ambulatory Visit (INDEPENDENT_AMBULATORY_CARE_PROVIDER_SITE_OTHER): Payer: BC Managed Care – PPO | Admitting: Family Medicine

## 2014-07-02 ENCOUNTER — Encounter: Payer: Self-pay | Admitting: Family Medicine

## 2014-07-02 VITALS — BP 94/62 | HR 90 | Temp 98.0°F | Ht 63.19 in | Wt 194.8 lb

## 2014-07-02 DIAGNOSIS — F329 Major depressive disorder, single episode, unspecified: Secondary | ICD-10-CM

## 2014-07-02 DIAGNOSIS — Z1211 Encounter for screening for malignant neoplasm of colon: Secondary | ICD-10-CM

## 2014-07-02 DIAGNOSIS — E669 Obesity, unspecified: Secondary | ICD-10-CM

## 2014-07-02 DIAGNOSIS — F3289 Other specified depressive episodes: Secondary | ICD-10-CM

## 2014-07-02 DIAGNOSIS — E782 Mixed hyperlipidemia: Secondary | ICD-10-CM

## 2014-07-02 DIAGNOSIS — Z0000 Encounter for general adult medical examination without abnormal findings: Secondary | ICD-10-CM

## 2014-07-02 DIAGNOSIS — Z79890 Hormone replacement therapy (postmenopausal): Secondary | ICD-10-CM

## 2014-07-02 MED ORDER — SERTRALINE HCL 100 MG PO TABS
150.0000 mg | ORAL_TABLET | Freq: Every day | ORAL | Status: DC
Start: 2014-07-02 — End: 2015-06-26

## 2014-07-02 MED ORDER — ESTROGENS CONJUGATED 0.45 MG PO TABS: 0.4500 mg | ORAL_TABLET | Freq: Every day | ORAL | Status: DC

## 2014-07-02 NOTE — Assessment & Plan Note (Signed)
Fairly well controlled- stable on ssri and pt does not do well without it  Will continue sertraline at current dose Enc exercise and socialization as well

## 2014-07-02 NOTE — Assessment & Plan Note (Signed)
D/w patient [id number]:options for colon cancer screening, including IFOB vs. colonoscopy.  Risks and benefits of both were discussed and patient voiced understanding.  Pt elects for: ifob card

## 2014-07-02 NOTE — Progress Notes (Signed)
Pre visit review using our clinic review tool, if applicable. No additional management support is needed unless otherwise documented below in the visit note. 

## 2014-07-02 NOTE — Assessment & Plan Note (Signed)
Disc pros/cons and risks of HRT-primarily risk of breast cancer with fam hx -- she voices understanding and chooses to continue the current HRT despite risks because it increases her quality of life Nl breast exam and nl mammogram as well

## 2014-07-02 NOTE — Progress Notes (Signed)
Subjective:    Patient ID: Ariel Compton, female    DOB: 18-Mar-1956, 58 y.o.   MRN: [id number]  HPI Here for health maintenance exam and to review chronic medical problems    Nothing new going on   Wt is down 2 lb with bmi of 34  She is working on it  Cutting back on high calorie foods / reasonable diet  Exercise- some swimming - does not do it that often and likes to walk   Colon cancer screening - she does not want to do a colonoscopy  Will do an IFOB  Flu vaccine 9/14  Mammogram 1/15  No lumps on self exam   Td 9/10    Hyperlipidemia  Lab Results  Component Value Date   CHOL 258* 02/28/2014   CHOL 214* 01/15/2013   CHOL 198 01/21/2008   Lab Results  Component Value Date   HDL 72.50 02/28/2014   HDL 40.98 01/15/2013   HDL 52.9 01/21/2008   Lab Results  Component Value Date   LDLCALC 151* 02/28/2014   LDLCALC 115* 01/21/2008   Lab Results  Component Value Date   TRIG 175.0* 02/28/2014   TRIG 159.0* 01/15/2013   TRIG 149 01/21/2008   Lab Results  Component Value Date   CHOLHDL 4 02/28/2014   CHOLHDL 3 01/15/2013   CHOLHDL 3.7 CALC 01/21/2008   Lab Results  Component Value Date   LDLDIRECT 114.0 01/15/2013   LDLDIRECT 121.4 10/25/2007   LDLDIRECT 121.4 10/23/2007   HDL and LDL went up  Could improve diet   Patient Active Problem List   Diagnosis Date Noted  . Acute sinusitis 04/09/2014  . Colon cancer screening 01/22/2013  . Routine general medical examination at a health care facility 11/11/2012  . Other screening mammogram 07/23/2012  . Postmenopausal HRT (hormone replacement therapy) 07/07/2011  . DUPUYTREN'S CONTRACTURE 03/16/2010  . MENOPAUSE, SURGICAL 09/04/2009  . ASTHMA, PERSISTENT, MILD 08/11/2008  . HYPERLIPIDEMIA 12/04/2007  . DEPRESSION 12/04/2007  . ALLERGIC RHINITIS 12/04/2007  . BLADDER PROLAPSE 12/04/2007  . OBESITY 10/22/2007   Past Medical History  Diagnosis Date  . Allergic rhinitis   . Depression   . Asthma     mild  . Plantar fasciitis    . Unspecified disorder of bladder   . Contracture of palmar fascia   . Mixed hyperlipidemia   . Symptomatic states associated with artificial menopause   . Obesity, unspecified    Past Surgical History  Procedure Laterality Date  . Vaginal hysterectomy  2008    Enterocele repair   History  Substance Use Topics  . Smoking status: Never Smoker   . Smokeless tobacco: Never Used  . Alcohol Use: No   Family History  Problem Relation Age of Onset  . Peripheral vascular disease Father     Smoker  . Prostate cancer Father   . Heart failure Father   . Other Father     Heart "problems"  . Healthy Mother   . Breast cancer Mother    Allergies  Allergen Reactions  . Amoxicillin     REACTION: GI UPSET  . Prednisone     Chills, cough   Current Outpatient Prescriptions on File Prior to Visit  Medication Sig Dispense Refill  . Fluticasone-Salmeterol (ADVAIR DISKUS) 100-50 MCG/DOSE AEPB Inhale 1 puff into the lungs as needed.  60 each  11  . metaxalone (SKELAXIN) 800 MG tablet take 1 tablet by mouth three times a day if needed for muscle spasm  30 tablet  1  . Multiple Vitamin (MULTIVITAMIN) tablet Take 1 tablet by mouth daily.         No current facility-administered medications on file prior to visit.    Review of Systems Review of Systems  Constitutional: Negative for fever, appetite change, fatigue and unexpected weight change.  Eyes: Negative for pain and visual disturbance.  Respiratory: Negative for cough and shortness of breath.   Cardiovascular: Negative for cp or palpitations    Gastrointestinal: Negative for nausea, diarrhea and constipation.  Genitourinary: Negative for urgency and frequency.  Skin: Negative for pallor or rash   Neurological: Negative for weakness, light-headedness, numbness and headaches.  Hematological: Negative for adenopathy. Does not bruise/bleed easily.  Psychiatric/Behavioral: Negative for dysphoric mood. The patient is not nervous/anxious.   (mood is overall very stable)       Objective:   Physical Exam  Constitutional: She appears well-developed and well-nourished. No distress.  obese and well appearing   HENT:  Head: Normocephalic and atraumatic.  Right Ear: External ear normal.  Left Ear: External ear normal.  Mouth/Throat: Oropharynx is clear and moist.  Eyes: Conjunctivae and EOM are normal. Pupils are equal, round, and reactive to light. No scleral icterus.  Neck: Normal range of motion. Neck supple. No JVD present. Carotid bruit is not present. No thyromegaly present.  Cardiovascular: Normal rate, regular rhythm, normal heart sounds and intact distal pulses.  Exam reveals no gallop.   Pulmonary/Chest: Effort normal and breath sounds normal. No respiratory distress. She has no wheezes. She exhibits no tenderness.  Abdominal: Soft. Bowel sounds are normal. She exhibits no distension, no abdominal bruit and no mass. There is no tenderness.  Genitourinary: No breast swelling, tenderness, discharge or bleeding.  Breast exam: No mass, nodules, thickening, tenderness, bulging, retraction, inflamation, nipple discharge or skin changes noted.  No axillary or clavicular LA.      Musculoskeletal: Normal range of motion. She exhibits no edema and no tenderness.  Lymphadenopathy:    She has no cervical adenopathy.  Neurological: She is alert. She has normal reflexes. No cranial nerve deficit. She exhibits normal muscle tone. Coordination normal.  Skin: Skin is warm and dry. No rash noted. No erythema. No pallor.  Psychiatric: She has a normal mood and affect.          Assessment & Plan:   Problem List Items Addressed This Visit     Other   HYPERLIPIDEMIA     Disc goals for lipids and reasons to control them Rev labs with pt Rev low sat fat diet in detail I would like to see LDL under 130 She is working on diet  Plan to re check lipids in 2 mo  ? Consider med if not imp    Relevant Orders      Lipid panel    OBESITY     Discussed how this problem influences overall health and the risks it imposes  Reviewed plan for weight loss with lower calorie diet (via better food choices and also portion control or program like weight watchers) and exercise building up to or more than 30 minutes 5 days per week including some aerobic activity   Pt is working on this -=commended     DEPRESSION     Fairly well controlled- stable on ssri and pt does not do well without it  Will continue sertraline at current dose Enc exercise and socialization as well     Relevant Medications      sertraline (  ZOLOFT) tablet   Postmenopausal HRT (hormone replacement therapy)     Disc pros/cons and risks of HRT-primarily risk of breast cancer with fam hx -- she voices understanding and chooses to continue the current HRT despite risks because it increases her quality of life Nl breast exam and nl mammogram as well     Routine general medical examination at a health care facility - Primary     Reviewed health habits including diet and exercise and skin cancer prevention Reviewed appropriate screening tests for age  Also reviewed health mt list, fam hx and immunization status , as well as social and family history   Labs reviewed      Colon cancer screening     D/w patient MV:HQIONGEre:options for colon cancer screening, including IFOB vs. colonoscopy.  Risks and benefits of both were discussed and patient voiced understanding.  Pt elects for: ifob card

## 2014-07-02 NOTE — Assessment & Plan Note (Signed)
Reviewed health habits including diet and exercise and skin cancer prevention Reviewed appropriate screening tests for age  Also reviewed health mt list, fam hx and immunization status , as well as social and family history   Labs reviewed  

## 2014-07-02 NOTE — Assessment & Plan Note (Signed)
Disc goals for lipids and reasons to control them Rev labs with pt Rev low sat fat diet in detail I would like to see LDL under 130 She is working on diet  Plan to re check lipids in 2 mo  ? Consider med if not imp

## 2014-07-02 NOTE — Assessment & Plan Note (Signed)
Discussed how this problem influences overall health and the risks it imposes  Reviewed plan for weight loss with lower calorie diet (via better food choices and also portion control or program like weight watchers) and exercise building up to or more than 30 minutes 5 days per week including some aerobic activity    Pt is working on this - commended  

## 2014-07-02 NOTE — Patient Instructions (Signed)
Schedule fasting labs in about 2 months  Keep working on diet for high cholesterol  Avoid red meat/ fried foods/ egg yolks/ fatty breakfast meats/ butter, cheese and high fat dairy/ and shellfish    Please do IFOB card for colon cancer screening  Keep working on weight loss   Fat and Cholesterol Control Diet Fat and cholesterol levels in your blood and organs are influenced by your diet. High levels of fat and cholesterol may lead to diseases of the heart, small and large blood vessels, gallbladder, liver, and pancreas. CONTROLLING FAT AND CHOLESTEROL WITH DIET Although exercise and lifestyle factors are important, your diet is key. That is because certain foods are known to raise cholesterol and others to lower it. The goal is to balance foods for their effect on cholesterol and more importantly, to replace saturated and trans fat with other types of fat, such as monounsaturated fat, polyunsaturated fat, and omega-3 fatty acids. On average, a person should consume no more than 15 to 17 g of saturated fat daily. Saturated and trans fats are considered "bad" fats, and they will raise LDL cholesterol. Saturated fats are primarily found in animal products such as meats, butter, and cream. However, that does not mean you need to give up all your favorite foods. Today, there are good tasting, low-fat, low-cholesterol substitutes for most of the things you like to eat. Choose low-fat or nonfat alternatives. Choose round or loin cuts of red meat. These types of cuts are lowest in fat and cholesterol. Chicken (without the skin), fish, veal, and ground [place] breast are great choices. Eliminate fatty meats, such as hot dogs and salami. Even shellfish have little or no saturated fat. Have a 3 oz (85 g) portion when you eat lean meat, poultry, or fish. Trans fats are also called "partially hydrogenated oils." They are oils that have been scientifically manipulated so that they are solid at room temperature  resulting in a longer shelf life and improved taste and texture of foods in which they are added. Trans fats are found in stick margarine, some tub margarines, cookies, crackers, and baked goods.  When baking and cooking, oils are a great substitute for butter. The monounsaturated oils are especially beneficial since it is believed they lower LDL and raise HDL. The oils you should avoid entirely are saturated tropical oils, such as coconut and palm.  Remember to eat a lot from food groups that are naturally free of saturated and trans fat, including fish, fruit, vegetables, beans, grains (barley, rice, couscous, bulgur wheat), and pasta (without cream sauces).  IDENTIFYING FOODS THAT LOWER FAT AND CHOLESTEROL  Soluble fiber may lower your cholesterol. This type of fiber is found in fruits such as apples, vegetables such as broccoli, potatoes, and carrots, legumes such as beans, peas, and lentils, and grains such as barley. Foods fortified with plant sterols (phytosterol) may also lower cholesterol. You should eat at least 2 g per day of these foods for a cholesterol lowering effect.  Read package labels to identify low-saturated fats, trans fat free, and low-fat foods at the supermarket. Select cheeses that have only 2 to 3 g saturated fat per ounce. Use a heart-healthy tub margarine that is free of trans fats or partially hydrogenated oil. When buying baked goods (cookies, crackers), avoid partially hydrogenated oils. Breads and muffins should be made from whole grains (whole-wheat or whole oat flour, instead of "flour" or "enriched flour"). Buy non-creamy canned soups with reduced salt and no added fats.  FOOD  PREPARATION TECHNIQUES  Never deep-fry. If you must fry, either stir-fry, which uses very little fat, or use non-stick cooking sprays. When possible, broil, bake, or roast meats, and steam vegetables. Instead of putting butter or margarine on vegetables, use lemon and herbs, applesauce, and cinnamon  (for squash and sweet potatoes). Use nonfat yogurt, salsa, and low-fat dressings for salads.  LOW-SATURATED FAT / LOW-FAT FOOD SUBSTITUTES Meats / Saturated Fat (g)  Avoid: Steak, marbled (3 oz/85 g) / 11 g  Choose: Steak, lean (3 oz/85 g) / 4 g  Avoid: Hamburger (3 oz/85 g) / 7 g  Choose: Hamburger, lean (3 oz/85 g) / 5 g  Avoid: Ham (3 oz/85 g) / 6 g  Choose: Ham, lean cut (3 oz/85 g) / 2.4 g  Avoid: Chicken, with skin, dark meat (3 oz/85 g) / 4 g  Choose: Chicken, skin removed, dark meat (3 oz/85 g) / 2 g  Avoid: Chicken, with skin, light meat (3 oz/85 g) / 2.5 g  Choose: Chicken, skin removed, light meat (3 oz/85 g) / 1 g Dairy / Saturated Fat (g)  Avoid: Whole milk (1 cup) / 5 g  Choose: Low-fat milk, 2% (1 cup) / 3 g  Choose: Low-fat milk, 1% (1 cup) / 1.5 g  Choose: Skim milk (1 cup) / 0.3 g  Avoid: Hard cheese (1 oz/28 g) / 6 g  Choose: Skim milk cheese (1 oz/28 g) / 2 to 3 g  Avoid: Cottage cheese, 4% fat (1 cup) / 6.5 g  Choose: Low-fat cottage cheese, 1% fat (1 cup) / 1.5 g  Avoid: Ice cream (1 cup) / 9 g  Choose: Sherbet (1 cup) / 2.5 g  Choose: Nonfat frozen yogurt (1 cup) / 0.3 g  Choose: Frozen fruit bar / trace  Avoid: Whipped cream (1 tbs) / 3.5 g  Choose: Nondairy whipped topping (1 tbs) / 1 g Condiments / Saturated Fat (g)  Avoid: Mayonnaise (1 tbs) / 2 g  Choose: Low-fat mayonnaise (1 tbs) / 1 g  Avoid: Butter (1 tbs) / 7 g  Choose: Extra light margarine (1 tbs) / 1 g  Avoid: Coconut oil (1 tbs) / 11.8 g  Choose: Olive oil (1 tbs) / 1.8 g  Choose: Corn oil (1 tbs) / 1.7 g  Choose: Safflower oil (1 tbs) / 1.2 g  Choose: Sunflower oil (1 tbs) / 1.4 g  Choose: Soybean oil (1 tbs) / 2.4 g  Choose: Canola oil (1 tbs) / 1 g Document Released: 11/28/2005 Document Revised: 03/25/2013 Document Reviewed: 05/19/2011 [employer] Patient Information 2015 [employer]. This information is not intended to replace advice given to you by  your health care provider. Make sure you discuss any questions you have with your health care provider.

## 2014-07-08 ENCOUNTER — Other Ambulatory Visit: Payer: Self-pay | Admitting: Family Medicine

## 2014-07-08 NOTE — Telephone Encounter (Signed)
Please refill times 5  thanks 

## 2014-07-08 NOTE — Telephone Encounter (Signed)
Electronic refill request, please advise  

## 2014-07-08 NOTE — Telephone Encounter (Signed)
done

## 2014-09-02 ENCOUNTER — Other Ambulatory Visit: Payer: BC Managed Care – PPO

## 2014-09-15 ENCOUNTER — Other Ambulatory Visit: Payer: BC Managed Care – PPO

## 2014-09-26 ENCOUNTER — Ambulatory Visit: Payer: Self-pay | Admitting: Family Medicine

## 2014-09-29 ENCOUNTER — Other Ambulatory Visit: Payer: BC Managed Care – PPO

## 2014-10-13 ENCOUNTER — Other Ambulatory Visit: Payer: BC Managed Care – PPO

## 2015-01-23 ENCOUNTER — Ambulatory Visit: Payer: Self-pay | Admitting: Registered Nurse

## 2015-02-07 ENCOUNTER — Other Ambulatory Visit: Payer: Self-pay | Admitting: Family Medicine

## 2015-04-09 ENCOUNTER — Other Ambulatory Visit: Payer: Self-pay | Admitting: Family Medicine

## 2015-04-09 MED ORDER — ALBUTEROL SULFATE HFA 108 (90 BASE) MCG/ACT IN AERS: 2.0000 | INHALATION_SPRAY | RESPIRATORY_TRACT | Status: DC | PRN

## 2015-04-09 NOTE — Telephone Encounter (Signed)
Fax refill request, not on med list and I don't think we have prescribed before, please advise

## 2015-04-09 NOTE — Telephone Encounter (Signed)
done

## 2015-04-09 NOTE — Telephone Encounter (Signed)
Please refill times 3 

## 2015-05-14 ENCOUNTER — Other Ambulatory Visit: Payer: Self-pay | Admitting: Family Medicine

## 2015-05-14 DIAGNOSIS — Z1231 Encounter for screening mammogram for malignant neoplasm of breast: Secondary | ICD-10-CM

## 2015-05-29 ENCOUNTER — Ambulatory Visit
Admission: RE | Admit: 2015-05-29 | Discharge: 2015-05-29 | Disposition: A | Discharge status: Home / Self Care | Payer: BLUE CROSS/BLUE SHIELD | Source: Ambulatory Visit | Attending: Family Medicine | Admitting: Family Medicine

## 2015-05-29 DIAGNOSIS — Z1231 Encounter for screening mammogram for malignant neoplasm of breast: Secondary | ICD-10-CM

## 2015-05-29 LAB — HM MAMMOGRAPHY: HM Mammogram: NORMAL

## 2015-06-01 ENCOUNTER — Encounter: Payer: Self-pay | Admitting: *Deleted

## 2015-06-01 ENCOUNTER — Encounter: Payer: Self-pay | Admitting: Family Medicine

## 2015-06-26 ENCOUNTER — Other Ambulatory Visit: Payer: Self-pay | Admitting: Family Medicine

## 2015-06-29 ENCOUNTER — Telehealth (INDEPENDENT_AMBULATORY_CARE_PROVIDER_SITE_OTHER): Payer: BLUE CROSS/BLUE SHIELD | Admitting: Family Medicine

## 2015-06-29 DIAGNOSIS — Z0000 Encounter for general adult medical examination without abnormal findings: Secondary | ICD-10-CM

## 2015-06-29 DIAGNOSIS — E782 Mixed hyperlipidemia: Secondary | ICD-10-CM

## 2015-06-29 NOTE — Telephone Encounter (Signed)
Refill request. Last prescribed on 07/02/14. Next apt 07/07/15.

## 2015-06-29 NOTE — Telephone Encounter (Signed)
Please refill for a year (since she has an upcoming appt) Thanks

## 2015-06-29 NOTE — Telephone Encounter (Signed)
-----   Message from Terri J Walsh sent at 06/24/2015 11:30 AM EDT ----- Regarding: Lab orders for Tuesday, 7.19.16 Patient is scheduled for CPX labs, please order future labs, Thanks , Terri  

## 2015-06-30 ENCOUNTER — Other Ambulatory Visit (INDEPENDENT_AMBULATORY_CARE_PROVIDER_SITE_OTHER): Payer: BLUE CROSS/BLUE SHIELD

## 2015-06-30 DIAGNOSIS — Z0000 Encounter for general adult medical examination without abnormal findings: Secondary | ICD-10-CM

## 2015-06-30 LAB — COMPREHENSIVE METABOLIC PANEL
ALK PHOS: 45 U/L (ref 39–117)
ALT: 328 U/L — AB (ref 0–35)
AST: 240 U/L — ABNORMAL HIGH (ref 0–37)
Albumin: 3.8 g/dL (ref 3.5–5.2)
BUN: 18 mg/dL (ref 6–23)
CO2: 29 meq/L (ref 19–32)
CREATININE: 0.64 mg/dL (ref 0.40–1.20)
Calcium: 9.1 mg/dL (ref 8.4–10.5)
Chloride: 103 mEq/L (ref 96–112)
GFR: 101.45 mL/min (ref >60.00)
GLUCOSE: 94 mg/dL (ref 70–99)
Potassium: 4.2 mEq/L (ref 3.5–5.1)
SODIUM: 139 meq/L (ref 135–145)
TOTAL PROTEIN: 6.7 g/dL (ref 6.0–8.3)
Total Bilirubin: 0.3 mg/dL (ref 0.2–1.2)

## 2015-06-30 LAB — LIPID PANEL
CHOLESTEROL: 219 mg/dL — AB (ref 0–200)
HDL: 71.9 mg/dL (ref >39.00)
LDL Cholesterol: 112 mg/dL — ABNORMAL HIGH (ref 0–99)
NonHDL: 147.1
TRIGLYCERIDES: 176 mg/dL — AB (ref 0.0–149.0)
Total CHOL/HDL Ratio: 3
VLDL: 35.2 mg/dL (ref 0.0–40.0)

## 2015-06-30 LAB — TSH: TSH: 1.5 u[IU]/mL (ref 0.35–4.50)

## 2015-07-07 ENCOUNTER — Ambulatory Visit (INDEPENDENT_AMBULATORY_CARE_PROVIDER_SITE_OTHER): Payer: BLUE CROSS/BLUE SHIELD | Admitting: Family Medicine

## 2015-07-07 ENCOUNTER — Encounter: Payer: Self-pay | Admitting: Family Medicine

## 2015-07-07 VITALS — BP 130/80 | HR 65 | Temp 98.5°F | Ht 63.25 in | Wt 203.2 lb

## 2015-07-07 DIAGNOSIS — E782 Mixed hyperlipidemia: Secondary | ICD-10-CM | POA: Diagnosis not present

## 2015-07-07 DIAGNOSIS — Z79890 Hormone replacement therapy (postmenopausal): Secondary | ICD-10-CM

## 2015-07-07 DIAGNOSIS — E669 Obesity, unspecified: Secondary | ICD-10-CM

## 2015-07-07 DIAGNOSIS — R748 Abnormal levels of other serum enzymes: Secondary | ICD-10-CM | POA: Diagnosis not present

## 2015-07-07 DIAGNOSIS — Z1211 Encounter for screening for malignant neoplasm of colon: Secondary | ICD-10-CM

## 2015-07-07 DIAGNOSIS — Z0000 Encounter for general adult medical examination without abnormal findings: Secondary | ICD-10-CM | POA: Diagnosis not present

## 2015-07-07 DIAGNOSIS — Z114 Encounter for screening for human immunodeficiency virus [HIV]: Secondary | ICD-10-CM

## 2015-07-07 LAB — CBC WITH DIFFERENTIAL/PLATELET
BASOS ABS: 0 10*3/uL (ref 0.0–0.1)
Basophils Relative: 0.6 % (ref 0.0–3.0)
EOS ABS: 0.3 10*3/uL (ref 0.0–0.7)
Eosinophils Relative: 4.7 % (ref 0.0–5.0)
HCT: 38.9 % (ref 36.0–46.0)
Hemoglobin: 13.1 g/dL (ref 12.0–15.0)
Lymphocytes Relative: 30.8 % (ref 12.0–46.0)
Lymphs Abs: 1.9 10*3/uL (ref 0.7–4.0)
MCHC: 33.6 g/dL (ref 30.0–36.0)
MCV: 87.9 fl (ref 78.0–100.0)
MONO ABS: 0.4 10*3/uL (ref 0.1–1.0)
MONOS PCT: 7 % (ref 3.0–12.0)
Neutro Abs: 3.5 10*3/uL (ref 1.4–7.7)
Neutrophils Relative %: 56.9 % (ref 43.0–77.0)
Platelets: 262 10*3/uL (ref 150.0–400.0)
RBC: 4.43 Mil/uL (ref 3.87–5.11)
RDW: 14.2 % (ref 11.5–15.5)
WBC: 6.1 10*3/uL (ref 4.0–10.5)

## 2015-07-07 LAB — HEPATIC FUNCTION PANEL
ALBUMIN: 3.9 g/dL (ref 3.5–5.2)
ALK PHOS: 49 U/L (ref 39–117)
ALT: 178 U/L — ABNORMAL HIGH (ref 0–35)
AST: 101 U/L — ABNORMAL HIGH (ref 0–37)
BILIRUBIN DIRECT: 0.1 mg/dL (ref 0.0–0.3)
TOTAL PROTEIN: 7.2 g/dL (ref 6.0–8.3)
Total Bilirubin: 0.4 mg/dL (ref 0.2–1.2)

## 2015-07-07 MED ORDER — ESTROGENS CONJUGATED 0.45 MG PO TABS: 0.4500 mg | ORAL_TABLET | Freq: Every day | ORAL | Status: DC

## 2015-07-07 NOTE — Assessment & Plan Note (Signed)
Disc goals for lipids and reasons to control them Rev labs with pt Rev low sat fat diet in detail Very high HDL  -ratio is improved Watching triglycerides

## 2015-07-07 NOTE — Assessment & Plan Note (Signed)
Discussed how this problem influences overall health and the risks it imposes  Reviewed plan for weight loss with lower calorie diet (via better food choices and also portion control or program like weight watchers) and exercise building up to or more than 30 minutes 5 days per week including some aerobic activity    

## 2015-07-07 NOTE — Assessment & Plan Note (Signed)
After reviewing pros/cons and risks (incl breast ca with fam hx)- pt chooses to continue low dose HRT  She has rather severe menop symptoms without it

## 2015-07-07 NOTE — Assessment & Plan Note (Signed)
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

## 2015-07-07 NOTE — Assessment & Plan Note (Signed)
Reviewed health habits including diet and exercise and skin cancer prevention Reviewed appropriate screening tests for age  Also reviewed health mt list, fam hx and immunization status , as well as social and family history   See HPI Labs reviewed IFOB kit for colon screening  Will get flu shot in the fall  Enc to work on wt loss  Cbc added - that was left off her prev labs  Will also w/u the LFT issue

## 2015-07-07 NOTE — Assessment & Plan Note (Signed)
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

## 2015-07-07 NOTE — Patient Instructions (Signed)
Stay off the acetaminophen  Labs today for liver/ hepatitis screen/cbc and HIV screen  Please do stool card for colon cancer screening  Get a flu shot in the fall  Work on healthy diet and exercise  Avoid caffeine -drink lots of water

## 2015-07-07 NOTE — Assessment & Plan Note (Signed)
Pt continues to decline colonoscopy at this time  Will do IFOB card

## 2015-07-07 NOTE — Progress Notes (Signed)
Subjective:    Patient ID: Ariel Compton, female    DOB: Feb 16, 1956, 59 y.o.   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)   Colon cancer screening - declines colonoscopy , will do ifob card    Flu shot - had in the fall   Mammogram 6/16 nl - 3D Self exam- no lumps  Has had a hysterectomy  (pap nl 05)  No gyn problems   Td 9/10   Liver function tests are high  Lab Results  Component Value Date   ALT 328* 06/30/2015   AST 240* 06/30/2015   ALKPHOS 45 06/30/2015   BILITOT 0.3 06/30/2015    Pt states she has been taking tylenol for HA (excedrin migraine) - 4 pills every day  No other sources of tylenol  Takes probiotics  Does not drink alcohol  She is interested in hepatitis screening  Never been dx with fatty liver  No abd pain or nausea  No hx of abd surg - still had gallbladder    Lab Results  Component Value Date   WBC 6.8 02/28/2014   HGB 13.2 02/28/2014   HCT 39.5 02/28/2014   MCV 89.1 02/28/2014   PLT 367.0 02/28/2014   (not done this visit)  Lab Results  Component Value Date   TSH 1.50 06/30/2015     Lab Results  Component Value Date   CHOL 219* 06/30/2015   CHOL 258* 02/28/2014   CHOL 214* 01/15/2013   Lab Results  Component Value Date   HDL 71.90 06/30/2015   HDL 72.50 02/28/2014   HDL 62.20 01/15/2013   Lab Results  Component Value Date   LDLCALC 112* 06/30/2015   LDLCALC 151* 02/28/2014   LDLCALC 115* 01/21/2008   Lab Results  Component Value Date   TRIG 176.0* 06/30/2015   TRIG 175.0* 02/28/2014   TRIG 159.0* 01/15/2013   Lab Results    Component Value Date   CHOLHDL 3 06/30/2015   CHOLHDL 4 02/28/2014   CHOLHDL 3 01/15/2013   Lab Results  Component Value Date   LDLDIRECT 114.0 01/15/2013   LDLDIRECT 121.4 10/25/2007   LDLDIRECT 121.4 10/23/2007    Overall ratio is a bit improved  Not eating fried foods   Patient Active Problem List   Diagnosis Date Noted  . Elevated liver enzymes 07/07/2015  . Screening for HIV (human immunodeficiency virus) 07/07/2015  . Acute sinusitis 04/09/2014  . Colon cancer screening 01/22/2013  . Routine general medical examination at a health care facility 11/11/2012  . Other screening mammogram 07/23/2012  . Postmenopausal HRT (hormone replacement therapy) 07/07/2011  . DUPUYTREN'S CONTRACTURE 03/16/2010  . MENOPAUSE, SURGICAL 09/04/2009  . ASTHMA, PERSISTENT, MILD 08/11/2008  . HYPERLIPIDEMIA 12/04/2007  . DEPRESSION 12/04/2007  . ALLERGIC RHINITIS 12/04/2007  . BLADDER PROLAPSE 12/04/2007  . Obesity 10/22/2007   Past Medical History  Diagnosis Date  . Allergic rhinitis   . Depression   . Asthma     mild  . Plantar fasciitis   . Unspecified disorder of bladder   .  Contracture of palmar fascia   . Mixed hyperlipidemia   . Symptomatic states associated with artificial menopause   . Obesity, unspecified    Past Surgical History  Procedure Laterality Date  . Vaginal hysterectomy  2008    Enterocele repair   History  Substance Use Topics  . Smoking status: Never Smoker   . Smokeless tobacco: Never Used  . Alcohol Use: No   Family History  Problem Relation Age of Onset  . Peripheral vascular disease Father     Smoker  . Prostate cancer Father   . Heart failure Father   . Other Father     Heart "problems"  . Healthy Mother   . Breast cancer Mother 57   Allergies  Allergen Reactions  . Amoxicillin     REACTION: GI UPSET  . Prednisone     Chills, cough   Current Outpatient Prescriptions on File Prior to Visit  Medication Sig Dispense Refill  . ADVAIR  DISKUS 100-50 MCG/DOSE AEPB INHALE 1 PUFF INTO THE LUNGS AS NEEDED AS DIRECTED 60 each 5  . albuterol (PROVENTIL HFA;VENTOLIN HFA) 108 (90 BASE) MCG/ACT inhaler Inhale 2 puffs into the lungs every 4 (four) hours as needed for wheezing or shortness of breath (or cough). 8 g 2  . metaxalone (SKELAXIN) 800 MG tablet take 1 tablet by mouth three times a day if needed for muscle spasm 30 tablet 4  . Multiple Vitamin (MULTIVITAMIN) tablet Take 1 tablet by mouth daily.      . sertraline (ZOLOFT) 100 MG tablet take 1 and 1/2 tablets by mouth once daily 135 tablet 3   No current facility-administered medications on file prior to visit.    Review of Systems Review of Systems  Constitutional: Negative for fever, appetite change, fatigue and unexpected weight change.  Eyes: Negative for pain and visual disturbance.  Respiratory: Negative for cough and shortness of breath.   Cardiovascular: Negative for cp or palpitations    Gastrointestinal: Negative for nausea, diarrhea and constipation.  Genitourinary: Negative for urgency and frequency.  Skin: Negative for pallor or rash   Neurological: Negative for weakness, light-headedness, numbness and headaches.  Hematological: Negative for adenopathy. Does not bruise/bleed easily.  Psychiatric/Behavioral: Negative for dysphoric mood. The patient is not nervous/anxious.  (mood is stable)       Objective:   Physical Exam  Constitutional: She appears well-developed and well-nourished. No distress.  obese and well appearing   HENT:  Head: Normocephalic and atraumatic.  Right Ear: External ear normal.  Left Ear: External ear normal.  Mouth/Throat: Oropharynx is clear and moist.  Eyes: Conjunctivae and EOM are normal. Pupils are equal, round, and reactive to light. No scleral icterus.  Neck: Normal range of motion. Neck supple. No JVD present. Carotid bruit is not present. No thyromegaly present.  Cardiovascular: Normal rate, regular rhythm, normal heart  sounds and intact distal pulses.  Exam reveals no gallop.   Pulmonary/Chest: Effort normal and breath sounds normal. No respiratory distress. She has no wheezes. She exhibits no tenderness.  Abdominal: Soft. Bowel sounds are normal. She exhibits no distension, no abdominal bruit and no mass. There is no hepatosplenomegaly. There is no tenderness. There is no rigidity, no rebound, no guarding, no CVA tenderness and negative Murphy's sign.  Genitourinary: No breast swelling, tenderness, discharge or bleeding.  Breast exam: No mass, nodules, thickening, tenderness, bulging, retraction, inflamation, nipple discharge or skin changes noted.  No axillary or clavicular LA.      Musculoskeletal: Normal range  of motion. She exhibits no edema or tenderness.  Lymphadenopathy:    She has no cervical adenopathy.  Neurological: She is alert. She has normal reflexes. No cranial nerve deficit. She exhibits normal muscle tone. Coordination normal.  Skin: Skin is warm and dry. No rash noted. No erythema. No pallor.  Fair complexion Few lentigo  Psychiatric: She has a normal mood and affect.          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  Also HIV screen  Cbc   No pain or symptoms Also disc poss of fatty liver  Consider [place] dep on results  Aware to avoid otc med and also etoh      Relevant Orders   Hepatic function panel   Hepatitis panel, acute   HYPERLIPIDEMIA    Disc goals for lipids and reasons to control them Rev labs with pt Rev low sat fat diet in detail Very high HDL  -ratio is improved Watching triglycerides       Obesity    Discussed how this problem  influences overall health and the risks it imposes  Reviewed plan for weight loss with lower calorie diet (via better food choices and also portion control or program like weight watchers) and exercise building up to or more than 30 minutes 5 days per week including some aerobic activity         Postmenopausal HRT (hormone replacement therapy)    After reviewing pros/cons and risks (incl breast ca with fam hx)- pt chooses to continue low dose HRT  She has rather severe menop symptoms without it        Routine general medical examination at a health care facility - Primary    Reviewed health habits including diet and exercise and skin cancer prevention Reviewed appropriate screening tests for age  Also reviewed health mt list, fam hx and immunization status , as well as social and family history   See HPI Labs reviewed IFOB kit for colon screening  Will get flu shot in the fall  Enc to work on wt loss  Cbc added - that was left off her prev labs  Will also w/u the LFT issue       Relevant Orders   CBC with Differential/Platelet   Screening for HIV (human immunodeficiency virus)    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

## 2015-07-07 NOTE — Progress Notes (Signed)
Pre visit review using our clinic review tool, if applicable. No additional management support is needed unless otherwise documented below in the visit note. 

## 2015-07-08 ENCOUNTER — Telehealth: Payer: Self-pay | Admitting: Family Medicine

## 2015-07-08 LAB — HEPATITIS PANEL, ACUTE
HCV AB: NEGATIVE
HEP A IGM: NONREACTIVE
HEP B C IGM: NONREACTIVE
Hepatitis B Surface Ag: NEGATIVE

## 2015-07-08 LAB — HIV ANTIBODY (ROUTINE TESTING W REFLEX): HIV 1&2 Ab, 4th Generation: NONREACTIVE

## 2015-07-08 NOTE — Telephone Encounter (Signed)
Patient returned Shapale's call about her lab results.  Please call patient A.S.A.P.

## 2015-07-08 NOTE — Telephone Encounter (Signed)
Addressed through result notes  

## 2015-07-08 NOTE — Telephone Encounter (Signed)
Called pt and no answer so left voicemail requesting pt to call office 

## 2015-07-16 ENCOUNTER — Other Ambulatory Visit: Payer: Self-pay | Admitting: Family Medicine

## 2015-07-16 DIAGNOSIS — R7989 Other specified abnormal findings of blood chemistry: Secondary | ICD-10-CM

## 2015-07-16 DIAGNOSIS — R945 Abnormal results of liver function studies: Secondary | ICD-10-CM

## 2015-07-24 ENCOUNTER — Other Ambulatory Visit (INDEPENDENT_AMBULATORY_CARE_PROVIDER_SITE_OTHER): Payer: BLUE CROSS/BLUE SHIELD

## 2015-07-24 ENCOUNTER — Encounter: Payer: Self-pay | Admitting: *Deleted

## 2015-07-24 ENCOUNTER — Encounter: Payer: Self-pay | Admitting: Family Medicine

## 2015-07-24 DIAGNOSIS — R945 Abnormal results of liver function studies: Secondary | ICD-10-CM

## 2015-07-24 DIAGNOSIS — R7989 Other specified abnormal findings of blood chemistry: Secondary | ICD-10-CM

## 2015-07-24 LAB — HEPATIC FUNCTION PANEL
ALT: 36 U/L — AB (ref 0–35)
AST: 33 U/L (ref 0–37)
Albumin: 3.8 g/dL (ref 3.5–5.2)
Alkaline Phosphatase: 46 U/L (ref 39–117)
BILIRUBIN DIRECT: 0.1 mg/dL (ref 0.0–0.3)
Total Bilirubin: 0.4 mg/dL (ref 0.2–1.2)
Total Protein: 6.9 g/dL (ref 6.0–8.3)

## 2015-07-26 ENCOUNTER — Other Ambulatory Visit: Payer: Self-pay | Admitting: Family Medicine

## 2015-08-09 ENCOUNTER — Other Ambulatory Visit: Payer: Self-pay | Admitting: Family Medicine

## 2015-08-10 NOTE — Telephone Encounter (Signed)
Electronic refill request, pt had CPE on 07/07/15, last refilled on 07/08/14 #30 with 4 additional refills, please advise

## 2015-08-10 NOTE — Telephone Encounter (Signed)
Will refill electronically  

## 2015-10-12 ENCOUNTER — Telehealth: Payer: Self-pay | Admitting: *Deleted

## 2015-10-12 NOTE — Telephone Encounter (Signed)
Pt said due to Back issues that Dr. Milinda Antisower is aware of she needs a note from Dr. Milinda Antisower saying she needs a "Standing Work Station" for work, pt said the getting up and down from a chair is really hard on her back and if you could write a note for a standing work station it would help her back issues/pain, please advise

## 2015-10-13 NOTE — Telephone Encounter (Signed)
Done and in IN box 

## 2015-10-13 NOTE — Telephone Encounter (Signed)
Left voicemail letting pt know letter ready for pick-up 

## 2015-10-26 ENCOUNTER — Telehealth: Payer: Self-pay

## 2015-10-26 MED ORDER — ESTROGENS CONJUGATED 0.3 MG PO TABS: ORAL_TABLET | ORAL | Status: DC

## 2015-10-26 NOTE — Telephone Encounter (Signed)
Pt left v/m; pt last annual exam on 07/07/15; pt wants to get new rx sent to [facility] [address] for premarin 0.3 mg instead of present dose 0.45 mg.Please advise.

## 2015-10-26 NOTE — Telephone Encounter (Signed)
I sent it  

## 2015-10-26 NOTE — Telephone Encounter (Signed)
Left voicemail letting pt know Rx sent to pharmacy  

## 2016-01-22 ENCOUNTER — Telehealth: Payer: Self-pay

## 2016-01-22 NOTE — Telephone Encounter (Signed)
Pt left v/m requesting letter for computer to have a canopy that will prevent overhead light from bothering pts eyes while on the computer. Last annual exam on 07/07/15. Unable to reach pt for more info.

## 2016-01-22 NOTE — Telephone Encounter (Signed)
I need a diagnosis to report for that - ? Migraine or vision disturbance (glaucoma/cataracts) etc.  Let me know the reason she needs it and it has to be medical.   Thanks

## 2016-01-22 NOTE — Telephone Encounter (Signed)
Unable to reach pt for more info

## 2016-01-26 NOTE — Telephone Encounter (Signed)
Still unable to reach pt, I called the only # on file and no answer but it doesn't ring

## 2016-01-27 ENCOUNTER — Encounter: Payer: Self-pay | Admitting: Family Medicine

## 2016-01-27 ENCOUNTER — Telehealth: Payer: Self-pay | Admitting: Family Medicine

## 2016-01-27 NOTE — Telephone Encounter (Signed)
Please let pt know that letter is done for her work.

## 2016-01-27 NOTE — Telephone Encounter (Signed)
Pt sent Dr. Milinda Antis a mychart message with the information Dr. Milinda Antis needed. Left voicemail letting pt know letter ready for pick-up

## 2016-01-27 NOTE — Telephone Encounter (Signed)
Left voicemail letting pt know letter ready for pick-up 

## 2016-04-11 ENCOUNTER — Ambulatory Visit (INDEPENDENT_AMBULATORY_CARE_PROVIDER_SITE_OTHER): Payer: BLUE CROSS/BLUE SHIELD

## 2016-04-11 ENCOUNTER — Encounter: Payer: Self-pay | Admitting: *Deleted

## 2016-04-11 ENCOUNTER — Ambulatory Visit
Admission: EM | Admit: 2016-04-11 | Discharge: 2016-04-11 | Disposition: A | Discharge status: Home / Self Care | Payer: BLUE CROSS/BLUE SHIELD | Attending: Family Medicine | Admitting: Family Medicine

## 2016-04-11 DIAGNOSIS — R350 Frequency of micturition: Secondary | ICD-10-CM | POA: Diagnosis not present

## 2016-04-11 DIAGNOSIS — R1010 Upper abdominal pain, unspecified: Secondary | ICD-10-CM

## 2016-04-11 DIAGNOSIS — IMO0001 Reserved for inherently not codable concepts without codable children: Secondary | ICD-10-CM

## 2016-04-11 DIAGNOSIS — R109 Unspecified abdominal pain: Secondary | ICD-10-CM

## 2016-04-11 MED ORDER — PHENAZOPYRIDINE HCL 200 MG PO TABS
200.0000 mg | ORAL_TABLET | Freq: Three times a day (TID) | ORAL | Status: DC | PRN
Start: 2016-04-11 — End: 2016-06-28

## 2016-04-11 MED ORDER — NITROFURANTOIN MONOHYD MACRO 100 MG PO CAPS: 100.0000 mg | ORAL_CAPSULE | Freq: Two times a day (BID) | ORAL | Status: DC

## 2016-04-11 NOTE — ED Provider Notes (Signed)
CSN: [id number]     Arrival date & time 04/11/16  1535 History   None   Nurses notes were reviewed.  Chief Complaint  Patient presents with  . Flank Pain  . Chills  . Nausea  . Urinary Frequency     Patient states on Saturday she started having symptoms of a UTI. She states that she's had right kidney and flank pain before. However while she is going to the bathroom more often and complaining of flank pain she does not have typical dysuria you expect. She does states she's been drinking a lot of water since Saturday trying to keep her urine diluted.  Display to the urine does look rather normal will get culture. She's a history of allergic rhinitis depression asthma plantar fasciitis. She's also history of obesity and artificial menopause she's had partial vaginal hysterectomy.  Never smoked father has peripheral vascular disease prostate cancer heart failure and mother has history of breast cancer.   (Consider location/radiation/quality/duration/timing/severity/associated sxs/prior Treatment) Patient is a 61 y.o. female presenting with flank pain and frequency. The history is provided by the patient. No language interpreter was used.  Flank Pain This is a new problem. The current episode started more than 2 days ago. The problem occurs constantly. The problem has been gradually worsening. Pertinent negatives include no chest pain and no abdominal pain. Nothing aggravates the symptoms. Nothing relieves the symptoms. She has tried water for the symptoms. The treatment provided no relief.  Urinary Frequency This is a new problem. The current episode started 2 days ago. The problem occurs constantly. The problem has been gradually worsening. Pertinent negatives include no chest pain and no abdominal pain. She has tried water for the symptoms. The treatment provided no relief.    Past Medical History  Diagnosis Date  . Allergic rhinitis   . Depression   . Asthma     mild  . Plantar  fasciitis   . Unspecified disorder of bladder   . Contracture of palmar fascia   . Mixed hyperlipidemia   . Symptomatic states associated with artificial menopause   . Obesity, unspecified    Past Surgical History  Procedure Laterality Date  . Vaginal hysterectomy  2008    Enterocele repair   Family History  Problem Relation Age of Onset  . Peripheral vascular disease Father     Smoker  . Prostate cancer Father   . Heart failure Father   . Other Father     Heart "problems"  . Healthy Mother   . Breast cancer Mother 22   Social History  Substance Use Topics  . Smoking status: Never Smoker   . Smokeless tobacco: Never Used  . Alcohol Use: No   OB History    No data available     Review of Systems  Cardiovascular: Negative for chest pain.  Gastrointestinal: Negative for abdominal pain.  Genitourinary: Positive for frequency and flank pain.  All other systems reviewed and are negative.   Allergies  Amoxicillin and Prednisone  Home Medications   Prior to Admission medications   Medication Sig Start Date End Date Taking? Authorizing Provider  ADVAIR DISKUS 100-50 MCG/DOSE AEPB INHALE 1 PUFF INTO THE LUNGS AS NEEDED AS DIRECTED 02/09/15  Yes Judy Pimple, MD  albuterol (PROVENTIL HFA;VENTOLIN HFA) 108 (90 BASE) MCG/ACT inhaler Inhale 2 puffs into the lungs every 4 (four) hours as needed for wheezing or shortness of breath (or cough). 04/09/15  Yes Judy Pimple, MD  estrogens, conjugated, (PREMARIN)  0.3 MG tablet Take 1 by mouth daily 10/26/15  Yes Judy Pimple, MD  Multiple Vitamin (MULTIVITAMIN) tablet Take 1 tablet by mouth daily.     Yes Historical Provider, MD  sertraline (ZOLOFT) 100 MG tablet take 1 and 1/2 tablets by mouth once daily 06/29/15  Yes Judy Pimple, MD  metaxalone [facility]) 800 MG tablet take 1 tablet by mouth three times a day if needed for muscle spasm 08/10/15   Judy Pimple, MD  nitrofurantoin, macrocrystal-monohydrate, (MACROBID) 100 MG capsule  Take 1 capsule (100 mg total) by mouth 2 (two) times daily. 04/11/16   Hassan Rowan, MD  phenazopyridine (PYRIDIUM) 200 MG tablet Take 1 tablet (200 mg total) by mouth 3 (three) times daily as needed for pain. 04/11/16   Hassan Rowan, MD   Meds Ordered and Administered this Visit  Medications - No data to display  BP 123/78 mmHg  Pulse 66  Temp(Src) 97.8 F (36.6 C) (Oral)  Resp 16  Ht 5\' 5"  (1.651 m)  Wt 194 lb (87.998 kg)  BMI 32.28 kg/m2  SpO2 97% No data found.   Physical Exam  Constitutional: She is oriented to person, place, and time. She appears well-developed and well-nourished.  HENT:  Head: Normocephalic and atraumatic.  Eyes: Conjunctivae are normal. Pupils are equal, round, and reactive to light.  Neck: Normal range of motion. Neck supple.  Cardiovascular: Normal rate, regular rhythm and normal heart sounds.   Pulmonary/Chest: Effort normal and breath sounds normal.  Abdominal: Soft. Bowel sounds are normal. She exhibits no distension and no mass. There is tenderness. There is guarding.  Musculoskeletal: Normal range of motion. She exhibits no tenderness.  Lymphadenopathy:    She has cervical adenopathy.  Neurological: She is alert and oriented to person, place, and time.  Skin: Skin is warm and dry.  Psychiatric: She has a normal mood and affect.  Vitals reviewed.   ED Course  Procedures (including critical care time)  Labs Review Labs Reviewed  URINALYSIS COMPLETEWITH MICROSCOPIC ([facility] ONLY) - Abnormal; Notable for the following:    Specific Gravity, Urine <1.005 (*)    Squamous Epithelial / LPF 0-5 (*)    All other components within normal limits    Imaging Review Dg Abd Acute W/chest  04/11/2016  CLINICAL DATA:  Pain with nausea and fatigue for 3 days EXAM: DG ABDOMEN ACUTE W/ 1V CHEST COMPARISON:  Chest radiograph November 13, 2012 FINDINGS: PA chest: Lungs are clear. Heart size and pulmonary vascularity are normal. No adenopathy. Supine and upright abdomen:  There is diffuse stool throughout the colon. There is no bowel dilatation or air-fluid level suggesting obstruction. No free air. There are phleboliths in the pelvis. There is degenerative change in the lumbar spine. IMPRESSION: Diffuse stool throughout colon. No bowel obstruction or free air. No lung edema or consolidation. Electronically Signed   By: Bretta Bang III M.D.   On: 04/11/2016 17:50     Visual Acuity Review  Right Eye Distance:   Left Eye Distance:   Bilateral Distance:    Right Eye Near:   Left Eye Near:    Bilateral Near:      Results for orders placed or performed during the hospital encounter of 04/11/16  Urinalysis complete, with microscopic  Result Value Ref Range   Color, Urine YELLOW YELLOW   APPearance CLEAR CLEAR   Glucose, UA NEGATIVE NEGATIVE mg/dL   Bilirubin Urine NEGATIVE NEGATIVE   Ketones, ur NEGATIVE NEGATIVE mg/dL   Specific Gravity,  Urine <1.005 (L) 1.005 - 1.030   Hgb urine dipstick NEGATIVE NEGATIVE   pH 5.5 5.0 - 8.0   Protein, ur NEGATIVE NEGATIVE mg/dL   Nitrite NEGATIVE NEGATIVE   Leukocytes, UA NEGATIVE NEGATIVE   RBC / HPF 0-5 0 - 5 RBC/hpf   WBC, UA 0-5 0 - 5 WBC/hpf   Bacteria, UA NONE SEEN NONE SEEN   Squamous Epithelial / LPF 0-5 (A) NONE SEEN    MDM   1. Flank pain, acute   2. Frequency    X-ray was basically negative for pneumonia or signs of kidney stones by KUB. She does have trouble constipation. We'll place her on Macrobid 100 mg 1 capsule twice day for 5 days and if urine is positive for an organism she may need to have 2-5 more days of antibiotics. If not better in a week please follow-up with PCP. Work note for today and tomorrow if needed.        Hassan RowanEugene Jessi Pitstick, MD 04/11/16 [phone]

## 2016-04-11 NOTE — Discharge Instructions (Signed)
Abdominal Pain, Adult °Many things can cause belly (abdominal) pain. Most times, the belly pain is not dangerous. Many cases of belly pain can be watched and treated at home. °HOME CARE  °· Do not take medicines that help you go poop (laxatives) unless told to by your doctor. °· Only take medicine as told by your doctor. °· Eat or drink as told by your doctor. Your doctor will tell you if you should be on a special diet. °GET HELP IF: °· You do not know what is causing your belly pain. °· You have belly pain while you are sick to your stomach (nauseous) or have runny poop (diarrhea). °· You have pain while you pee or poop. °· Your belly pain wakes you up at night. °· You have belly pain that gets worse or better when you eat. °· You have belly pain that gets worse when you eat fatty foods. °· You have a fever. °GET HELP RIGHT AWAY IF:  °· The pain does not go away within 2 hours. °· You keep throwing up (vomiting). °· The pain changes and is only in the right or left part of the belly. °· You have bloody or tarry looking poop. °MAKE SURE YOU:  °· Understand these instructions. °· Will watch your condition. °· Will get help right away if you are not doing well or get worse. °  °This information is not intended to replace advice given to you by your health care provider. Make sure you discuss any questions you have with your health care provider. °  °Document Released: 05/16/2008 Document Revised: 12/19/2014 Document Reviewed: 08/07/2013 °Elsevier Interactive Patient Education ©2016 [employer] ° °Flank Pain °Flank pain is pain in your side. The flank is the area of your side between your upper belly (abdomen) and your back. Pain in this area can be caused by many different things. °HOME CARE °Home care and treatment will depend on the cause of your pain. °· Rest as told by your doctor. °· Drink enough fluids to keep your pee (urine) clear or pale yellow.   °· Only take medicine as told by your doctor. °· Tell your  doctor about any changes in your pain. °· Follow up with your doctor. °GET HELP RIGHT AWAY IF:  °· Your pain does not get better with medicine.   °· You have new symptoms or your symptoms get worse. °· Your pain gets worse.   °· You have belly (abdominal) pain.   °· You are short of breath.   °· You always feel sick to your stomach (nauseous).   °· You keep throwing up (vomiting).   °· You have puffiness (swelling) in your belly.   °· You feel light-headed or you pass out (faint).   °· You have blood in your pee. °· You have a fever or lasting symptoms for more than 2-3 days. °· You have a fever and your symptoms suddenly get worse. °MAKE SURE YOU:  °· Understand these instructions. °· Will watch your condition. °· Will get help right away if you are not doing well or get worse. °  °This information is not intended to replace advice given to you by your health care provider. Make sure you discuss any questions you have with your health care provider. °  °Document Released: 09/06/2008 Document Revised: 12/19/2014 Document Reviewed: 07/12/2012 °Elsevier Interactive Patient Education ©2016 [employer] ° °

## 2016-04-11 NOTE — ED Notes (Signed)
Right flank pain, chills, nausea, urinary frequency, x2 days. Hx of kidney stones.

## 2016-04-13 LAB — URINALYSIS COMPLETE WITH MICROSCOPIC (ARMC ONLY)
BACTERIA UA: NONE SEEN
Bilirubin Urine: NEGATIVE
GLUCOSE, UA: NEGATIVE mg/dL
Ketones, ur: NEGATIVE mg/dL
Leukocytes, UA: NEGATIVE
Nitrite: NEGATIVE
Protein, ur: NEGATIVE mg/dL
Specific Gravity, Urine: <1.005 — ABNORMAL LOW (ref 1.005–1.030)
pH: 5.5 (ref 5.0–8.0)

## 2016-04-19 ENCOUNTER — Encounter: Payer: Self-pay | Admitting: Family Medicine

## 2016-04-19 ENCOUNTER — Ambulatory Visit (INDEPENDENT_AMBULATORY_CARE_PROVIDER_SITE_OTHER): Payer: BLUE CROSS/BLUE SHIELD | Admitting: Family Medicine

## 2016-04-19 VITALS — BP 132/86 | HR 73 | Temp 98.0°F | Ht 63.25 in | Wt 212.5 lb

## 2016-04-19 DIAGNOSIS — R109 Unspecified abdominal pain: Secondary | ICD-10-CM | POA: Diagnosis not present

## 2016-04-19 DIAGNOSIS — R1011 Right upper quadrant pain: Secondary | ICD-10-CM

## 2016-04-19 LAB — POC URINALSYSI DIPSTICK (AUTOMATED)
Bilirubin, UA: NEGATIVE
Blood, UA: 10
Glucose, UA: NEGATIVE
KETONES UA: NEGATIVE
NITRITE UA: NEGATIVE
PH UA: 6
PROTEIN UA: NEGATIVE
Spec Grav, UA: 1.015
UROBILINOGEN UA: 0.2

## 2016-04-19 NOTE — Assessment & Plan Note (Signed)
Pt is tender in RUQ and flank  Neg ua  Also some nausea  Want to r/o gallstones Adv to avoid cheese and other fatty foods  Ref for abd [place]

## 2016-04-19 NOTE — Progress Notes (Signed)
Pre visit review using our clinic review tool, if applicable. No additional management support is needed unless otherwise documented below in the visit note. 

## 2016-04-19 NOTE — Patient Instructions (Signed)
I want to check an ultrasound for possible gallstones  Stop at check out regarding referral  Avoid fatty foods for now  If symptoms get severe- go to the ED

## 2016-04-19 NOTE — Progress Notes (Signed)
Subjective:    Patient ID: Ariel NeighborsAlana A Kernan, female    DOB: [birth year], 60 y.o.   MRN: [id number]  HPI Here for persistent R sided (flank) pain  For quite a while  ? Muscle strain -she sits on a ball at work Hurts more with sitting and less with standing  Flexing to the R makes it worse   Has had a little nausea   Likes cheese-not a lot of other fatty foods    No rash  Has gained weight- up 18 lb with bmi of 37- eating protein bars made her gain   Not a lot of exercise   Works sedentary job for the most part   ua is clear Results for orders placed or performed in visit on 04/19/16  POCT Urinalysis Dipstick (Automated)  Result Value Ref Range   Color, UA Yellow    Clarity, UA Clear    Glucose, UA Negative    Bilirubin, UA Negative    Ketones, UA Negative    Spec Grav, UA 1.015    Blood, UA 10 Ery/uL    pH, UA 6.0    Protein, UA Negative    Urobilinogen, UA 0.2    Nitrite, UA Negative    Leukocytes, UA small (1+) (A) Negative    No Gi symptoms -no stool changes  No midline abdominal pain   Patient Active Problem List   Diagnosis Date Noted  . Elevated liver enzymes 07/07/2015  . Screening for HIV (human immunodeficiency virus) 07/07/2015  . Acute sinusitis 04/09/2014  . Colon cancer screening 01/22/2013  . Routine general medical examination at a health care facility 11/11/2012  . Other screening mammogram 07/23/2012  . Postmenopausal HRT (hormone replacement therapy) 07/07/2011  . DUPUYTREN'S CONTRACTURE 03/16/2010  . MENOPAUSE, SURGICAL 09/04/2009  . ASTHMA, PERSISTENT, MILD 08/11/2008  . HYPERLIPIDEMIA 12/04/2007  . DEPRESSION 12/04/2007  . ALLERGIC RHINITIS 12/04/2007  . BLADDER PROLAPSE 12/04/2007  . Obesity 10/22/2007   Past Medical History  Diagnosis Date  . Allergic rhinitis   . Depression   . Asthma     mild  . Plantar fasciitis   . Unspecified disorder of bladder   . Contracture of palmar fascia   . Mixed hyperlipidemia   . Symptomatic  states associated with artificial menopause   . Obesity, unspecified    Past Surgical History  Procedure Laterality Date  . Vaginal hysterectomy  2008    Enterocele repair   Social History  Substance Use Topics  . Smoking status: Never Smoker   . Smokeless tobacco: Never Used  . Alcohol Use: No   Family History  Problem Relation Age of Onset  . Peripheral vascular disease Father     Smoker  . Prostate cancer Father   . Heart failure Father   . Other Father     Heart "problems"  . Healthy Mother   . Breast cancer Mother 1580   Allergies  Allergen Reactions  . Amoxicillin     REACTION: GI UPSET  . Prednisone     Chills, cough   Current Outpatient Prescriptions on File Prior to Visit  Medication Sig Dispense Refill  . ADVAIR DISKUS 100-50 MCG/DOSE AEPB INHALE 1 PUFF INTO THE LUNGS AS NEEDED AS DIRECTED 60 each 5  . albuterol (PROVENTIL HFA;VENTOLIN HFA) 108 (90 BASE) MCG/ACT inhaler Inhale 2 puffs into the lungs every 4 (four) hours as needed for wheezing or shortness of breath (or cough). 8 g 2  . estrogens, conjugated, (PREMARIN) 0.3 MG tablet  Take 1 by mouth daily 30 tablet 11  . metaxalone (SKELAXIN) 800 MG tablet take 1 tablet by mouth three times a day if needed for muscle spasm 30 tablet 5  . Multiple Vitamin (MULTIVITAMIN) tablet Take 1 tablet by mouth daily.      . phenazopyridine (PYRIDIUM) 200 MG tablet Take 1 tablet (200 mg total) by mouth 3 (three) times daily as needed for pain. 15 tablet 0  . sertraline (ZOLOFT) 100 MG tablet take 1 and 1/2 tablets by mouth once daily 135 tablet 3   No current facility-administered medications on file prior to visit.    Review of Systems    Review of Systems  Constitutional: Negative for fever, appetite change, fatigue and unexpected weight change.  Eyes: Negative for pain and visual disturbance.  Respiratory: Negative for cough and shortness of breath.   Cardiovascular: Negative for cp or palpitations    Gastrointestinal:  Negative for blood in stool, dark stool , diarrhea and constipation.  Genitourinary: Negative for urgency and frequency.  Skin: Negative for pallor or rash   Neurological: Negative for weakness, light-headedness, numbness and headaches.  Hematological: Negative for adenopathy. Does not bruise/bleed easily.  Psychiatric/Behavioral: Negative for dysphoric mood. The patient is not nervous/anxious.      Objective:   Physical Exam  Constitutional: She appears well-developed and well-nourished. No distress.  obese and well appearing   HENT:  Head: Normocephalic and atraumatic.  Mouth/Throat: Oropharynx is clear and moist.  Eyes: Conjunctivae and EOM are normal. Pupils are equal, round, and reactive to light. No scleral icterus.  No icterus   Neck: Normal range of motion. Neck supple.  Cardiovascular: Normal rate, regular rhythm and normal heart sounds.   Pulmonary/Chest: Effort normal and breath sounds normal. No respiratory distress. She has no wheezes. She has no rales.  Abdominal: Soft. Bowel sounds are normal. She exhibits no distension and no mass. There is no hepatosplenomegaly. There is tenderness in the right upper quadrant. There is positive Murphy's sign. There is no rebound, no guarding, no CVA tenderness and no tenderness at McBurney's point.  Lymphadenopathy:    She has no cervical adenopathy.  Neurological: She is alert. She has normal reflexes.  Skin: Skin is warm and dry. No erythema. No pallor.  No jaundice   Psychiatric: She has a normal mood and affect.          Assessment & Plan:   Problem List Items Addressed This Visit      Other   RUQ abdominal pain - Primary    Pt is tender in RUQ and flank  Neg ua  Also some nausea  Want to r/o gallstones Adv to avoid cheese and other fatty foods  Ref for abd [place]      Relevant Orders   US Abdomen Complete    Other Visit Diagnoses    Right flank pain        Relevant Orders    POCT Urinalysis Dipstick (Automated)  (Completed)    US Abdomen Complete

## 2016-04-21 DIAGNOSIS — J45909 Unspecified asthma, uncomplicated: Secondary | ICD-10-CM | POA: Diagnosis not present

## 2016-04-21 DIAGNOSIS — R300 Dysuria: Secondary | ICD-10-CM | POA: Diagnosis not present

## 2016-04-21 DIAGNOSIS — R1011 Right upper quadrant pain: Secondary | ICD-10-CM | POA: Diagnosis not present

## 2016-04-21 DIAGNOSIS — K828 Other specified diseases of gallbladder: Secondary | ICD-10-CM | POA: Diagnosis not present

## 2016-04-21 DIAGNOSIS — Z880 Allergy status to penicillin: Secondary | ICD-10-CM | POA: Diagnosis not present

## 2016-04-21 DIAGNOSIS — Z888 Allergy status to other drugs, medicaments and biological substances status: Secondary | ICD-10-CM | POA: Diagnosis not present

## 2016-04-22 ENCOUNTER — Encounter: Payer: Self-pay | Admitting: Family Medicine

## 2016-04-27 ENCOUNTER — Other Ambulatory Visit: Payer: BLUE CROSS/BLUE SHIELD

## 2016-04-30 ENCOUNTER — Encounter: Payer: Self-pay | Admitting: Family Medicine

## 2016-05-18 ENCOUNTER — Other Ambulatory Visit: Payer: Self-pay | Admitting: Family Medicine

## 2016-05-18 DIAGNOSIS — Z1231 Encounter for screening mammogram for malignant neoplasm of breast: Secondary | ICD-10-CM

## 2016-05-30 ENCOUNTER — Encounter: Payer: Self-pay | Admitting: Family Medicine

## 2016-05-31 ENCOUNTER — Other Ambulatory Visit: Payer: Self-pay | Admitting: Family Medicine

## 2016-05-31 ENCOUNTER — Ambulatory Visit
Admission: RE | Admit: 2016-05-31 | Discharge: 2016-05-31 | Disposition: A | Discharge status: Home / Self Care | Payer: BLUE CROSS/BLUE SHIELD | Source: Ambulatory Visit | Attending: Family Medicine | Admitting: Family Medicine

## 2016-05-31 DIAGNOSIS — Z1231 Encounter for screening mammogram for malignant neoplasm of breast: Secondary | ICD-10-CM | POA: Insufficient documentation

## 2016-06-01 ENCOUNTER — Telehealth: Payer: Self-pay | Admitting: Family Medicine

## 2016-06-01 MED ORDER — ZOLPIDEM TARTRATE 10 MG PO TABS: 10.0000 mg | ORAL_TABLET | Freq: Every evening | ORAL | Status: DC | PRN

## 2016-06-01 NOTE — Telephone Encounter (Signed)
Please call in for pt for upcoming travel

## 2016-06-01 NOTE — Telephone Encounter (Signed)
Rx called in as prescribed 

## 2016-06-22 ENCOUNTER — Other Ambulatory Visit: Payer: Self-pay | Admitting: *Deleted

## 2016-06-22 MED ORDER — SERTRALINE HCL 100 MG PO TABS: 150.0000 mg | ORAL_TABLET | Freq: Every day | ORAL | Status: DC

## 2016-06-22 NOTE — Telephone Encounter (Signed)
Please give her a 3 mo refill to get by until her appt Thanks

## 2016-06-22 NOTE — Telephone Encounter (Signed)
Pt has CPE on 07/19/16, last filled on 06/29/15 #135 tabs with 3 additional refills, please advise

## 2016-06-22 NOTE — Telephone Encounter (Signed)
done

## 2016-06-26 DIAGNOSIS — H6981 Other specified disorders of Eustachian tube, right ear: Secondary | ICD-10-CM | POA: Diagnosis not present

## 2016-06-28 ENCOUNTER — Encounter: Payer: Self-pay | Admitting: Primary Care

## 2016-06-28 ENCOUNTER — Ambulatory Visit (INDEPENDENT_AMBULATORY_CARE_PROVIDER_SITE_OTHER): Payer: BLUE CROSS/BLUE SHIELD | Admitting: Primary Care

## 2016-06-28 VITALS — BP 122/80 | HR 62 | Temp 97.8°F | Ht 63.25 in | Wt 200.1 lb

## 2016-06-28 DIAGNOSIS — R059 Cough, unspecified: Secondary | ICD-10-CM

## 2016-06-28 DIAGNOSIS — R05 Cough: Secondary | ICD-10-CM | POA: Diagnosis not present

## 2016-06-28 DIAGNOSIS — H9201 Otalgia, right ear: Secondary | ICD-10-CM | POA: Diagnosis not present

## 2016-06-28 MED ORDER — AZITHROMYCIN 250 MG PO TABS: ORAL_TABLET | ORAL | Status: DC

## 2016-06-28 NOTE — Progress Notes (Signed)
Pre visit review using our clinic review tool, if applicable. No additional management support is needed unless otherwise documented below in the visit note. 

## 2016-06-28 NOTE — Patient Instructions (Signed)
Start Azithromycin antibiotics. Take 2 tablets by mouth today, then 1 tablet daily for 4 additional days.  Continue Zyrtec and Nasoquart.  Please notify me if no improvement in 3-4 days.  It was a pleasure meeting you!

## 2016-06-28 NOTE — Progress Notes (Signed)
Subjective:    Patient ID: Ariel Compton, female    DOB: 1956-02-17, 60 y.o.   MRN: [id number]  HPI  Ms. Ariel Compton is a 60 year old female who presents today with a chief complaint of ear pain. Her pain is located to the right ear and has been present for the past 1 week.   She presented to Urgent Care on Sunday this week and was told she had a "retracted ear drum". She's been taking benadryl, using Nasoquart, and taking Zyrtec as directed without improvement. She started running fevers 1 week ago which have been present intermittently since. Her last fever was last night at 101. She's also experiencing a coughing with productive (green sputum) that has been present for the past several weeks.   Overall she's noticed no improvement with her ear pain or cough.  Review of Systems  Constitutional: Positive for fever, chills and fatigue.  HENT: Positive for ear pain. Negative for congestion and sore throat.   Respiratory: Positive for cough. Negative for shortness of breath and wheezing.   Cardiovascular: Negative for chest pain.       Past Medical History  Diagnosis Date  . Allergic rhinitis   . Depression   . Asthma     mild  . Plantar fasciitis   . Unspecified disorder of bladder   . Contracture of palmar fascia   . Mixed hyperlipidemia   . Symptomatic states associated with artificial menopause   . Obesity, unspecified      Social History   Social History  . Marital Status: Married    Spouse Name: N/A  . Number of Children: N/A  . Years of Education: N/A   Occupational History  . [employer] [facility] [employer]    Social History Main Topics  . Smoking status: Never Smoker   . Smokeless tobacco: Never Used  . Alcohol Use: No  . Drug Use: No  . Sexual Activity: Not on file   Other Topics Concern  . 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 Vaginal hysterectomy  2008   Enterocele repair    Family History  Problem Relation Age of Onset  . Peripheral vascular disease Father     Smoker  . Prostate cancer Father   . Heart failure Father   . Other Father     Heart "problems"  . Healthy Mother     Allergies  Allergen Reactions  . Amoxicillin     REACTION: GI UPSET  . Prednisone     Chills, cough    Current Outpatient Prescriptions on File Prior to Visit  Medication Sig Dispense Refill  . ADVAIR DISKUS 100-50 MCG/DOSE AEPB INHALE 1 PUFF INTO THE LUNGS AS NEEDED AS DIRECTED 60 each 5  . albuterol (PROVENTIL HFA;VENTOLIN HFA) 108 (90 BASE) MCG/ACT inhaler Inhale 2 puffs into the lungs every 4 (four) hours as needed for wheezing or shortness of breath (or cough). 8 g 2  . estrogens, conjugated, (PREMARIN) 0.3 MG tablet Take 1 by mouth daily 30 tablet 11  . Multiple Vitamin (MULTIVITAMIN) tablet Take 1 tablet by mouth daily.      . sertraline (ZOLOFT) 100 MG tablet Take 1.5 tablets (150 mg total) by mouth daily. 135 tablet 0  . metaxalone (SKELAXIN) 800 MG tablet take 1 tablet by mouth three times a day if needed for muscle spasm (Patient not taking:  Reported on 06/28/2016) 30 tablet 5   No current facility-administered medications on file prior to visit.    BP 122/80 mmHg  Pulse 62  Temp(Src) 97.8 F (36.6 C) (Oral)  Ht 5' 3.25" (1.607 m)  Wt 200 lb 1.9 oz (90.774 kg)  BMI 35.15 kg/m2  SpO2 97%    Objective:   Physical Exam  Constitutional: She appears well-nourished.  HENT:  Right Ear: Ear canal normal.  Left Ear: Ear canal normal.  Nose: Right sinus exhibits no maxillary sinus tenderness and no frontal sinus tenderness. Left sinus exhibits no maxillary sinus tenderness and no frontal sinus tenderness.  Mouth/Throat: Oropharynx is clear and moist.  Dullness to bilateral TMs. No obvious signs of acute infection.  Eyes: Conjunctivae are normal.  Neck: Neck supple.  Cardiovascular: Normal rate and regular rhythm.   Pulmonary/Chest:  Effort normal and breath sounds normal. She has no wheezes. She has no rales.  Lymphadenopathy:    She has no cervical adenopathy.  Skin: Skin is warm and dry.          Assessment & Plan:  Otalgia:  Located to right ear 1 week. No improvement with OTC treatment including antihistamines and nasal steroids. Exam today without evidence of acute infection, however dullness to bilateral TMs present. Lungs clear. Given resistant fevers of 100-101, lack of improvement with ear discomfort, and presence of cough, productive, green sputum will treat. Prescription for Z-Pak provided. Continue Nasacort and antihistamines. Discussed ibuprofen use as needed for inflammation and pain. She is to update [place] no improvement.  [name], NP

## 2016-06-30 ENCOUNTER — Telehealth: Payer: Self-pay | Admitting: Family Medicine

## 2016-06-30 DIAGNOSIS — Z0000 Encounter for general adult medical examination without abnormal findings: Secondary | ICD-10-CM

## 2016-06-30 NOTE — Telephone Encounter (Signed)
-----   Message from Alvina Chouerri J Walsh sent at 06/29/2016  2:35 PM EDT ----- Regarding: Lab orders for Wednesday, 7.26.17 Patient is scheduled for CPX labs, please order future labs, Thanks , Camelia Engerri

## 2016-07-06 ENCOUNTER — Other Ambulatory Visit (INDEPENDENT_AMBULATORY_CARE_PROVIDER_SITE_OTHER): Payer: BLUE CROSS/BLUE SHIELD

## 2016-07-06 DIAGNOSIS — Z0000 Encounter for general adult medical examination without abnormal findings: Secondary | ICD-10-CM | POA: Diagnosis not present

## 2016-07-06 LAB — CBC WITH DIFFERENTIAL/PLATELET
BASOS ABS: 0 10*3/uL (ref 0.0–0.1)
Basophils Relative: 0.5 % (ref 0.0–3.0)
EOS ABS: 0.2 10*3/uL (ref 0.0–0.7)
Eosinophils Relative: 2.9 % (ref 0.0–5.0)
HEMATOCRIT: 39 % (ref 36.0–46.0)
HEMOGLOBIN: 13.3 g/dL (ref 12.0–15.0)
LYMPHS PCT: 25.9 % (ref 12.0–46.0)
Lymphs Abs: 1.8 10*3/uL (ref 0.7–4.0)
MCHC: 33.9 g/dL (ref 30.0–36.0)
MCV: 86.5 fl (ref 78.0–100.0)
MONOS PCT: 6 % (ref 3.0–12.0)
Monocytes Absolute: 0.4 10*3/uL (ref 0.1–1.0)
NEUTROS ABS: 4.4 10*3/uL (ref 1.4–7.7)
Neutrophils Relative %: 64.7 % (ref 43.0–77.0)
PLATELETS: 302 10*3/uL (ref 150.0–400.0)
RBC: 4.52 Mil/uL (ref 3.87–5.11)
RDW: 14.2 % (ref 11.5–15.5)
WBC: 6.8 10*3/uL (ref 4.0–10.5)

## 2016-07-06 LAB — COMPREHENSIVE METABOLIC PANEL
ALBUMIN: 4 g/dL (ref 3.5–5.2)
ALK PHOS: 47 U/L (ref 39–117)
ALT: 10 U/L (ref 0–35)
AST: 19 U/L (ref 0–37)
BILIRUBIN TOTAL: 0.3 mg/dL (ref 0.2–1.2)
BUN: 16 mg/dL (ref 6–23)
CALCIUM: 9.5 mg/dL (ref 8.4–10.5)
CO2: 29 meq/L (ref 19–32)
CREATININE: 0.65 mg/dL (ref 0.40–1.20)
Chloride: 104 mEq/L (ref 96–112)
GFR: 99.29 mL/min (ref >60.00)
Glucose, Bld: 81 mg/dL (ref 70–99)
Potassium: 3.5 mEq/L (ref 3.5–5.1)
Sodium: 141 mEq/L (ref 135–145)
TOTAL PROTEIN: 7.4 g/dL (ref 6.0–8.3)

## 2016-07-06 LAB — LIPID PANEL
CHOL/HDL RATIO: 3
CHOLESTEROL: 179 mg/dL (ref 0–200)
HDL: 52.5 mg/dL (ref >39.00)
LDL Cholesterol: 98 mg/dL (ref 0–99)
NonHDL: 126.85
TRIGLYCERIDES: 145 mg/dL (ref 0.0–149.0)
VLDL: 29 mg/dL (ref 0.0–40.0)

## 2016-07-06 LAB — TSH: TSH: 3.47 u[IU]/mL (ref 0.35–4.50)

## 2016-07-13 ENCOUNTER — Encounter: Payer: BLUE CROSS/BLUE SHIELD | Admitting: Family Medicine

## 2016-07-19 ENCOUNTER — Ambulatory Visit (INDEPENDENT_AMBULATORY_CARE_PROVIDER_SITE_OTHER): Payer: BLUE CROSS/BLUE SHIELD | Admitting: Family Medicine

## 2016-07-19 ENCOUNTER — Encounter: Payer: Self-pay | Admitting: Family Medicine

## 2016-07-19 VITALS — BP 116/80 | HR 59 | Temp 97.9°F | Ht 63.25 in | Wt 199.5 lb

## 2016-07-19 DIAGNOSIS — Z0000 Encounter for general adult medical examination without abnormal findings: Secondary | ICD-10-CM

## 2016-07-19 DIAGNOSIS — Z79890 Hormone replacement therapy (postmenopausal): Secondary | ICD-10-CM

## 2016-07-19 DIAGNOSIS — E782 Mixed hyperlipidemia: Secondary | ICD-10-CM | POA: Diagnosis not present

## 2016-07-19 DIAGNOSIS — E669 Obesity, unspecified: Secondary | ICD-10-CM

## 2016-07-19 DIAGNOSIS — Z1211 Encounter for screening for malignant neoplasm of colon: Secondary | ICD-10-CM

## 2016-07-19 DIAGNOSIS — H9201 Otalgia, right ear: Secondary | ICD-10-CM

## 2016-07-19 MED ORDER — SERTRALINE HCL 100 MG PO TABS: 150.0000 mg | ORAL_TABLET | Freq: Every day | ORAL | 3 refills | Status: DC

## 2016-07-19 MED ORDER — ALBUTEROL SULFATE HFA 108 (90 BASE) MCG/ACT IN AERS: 2.0000 | INHALATION_SPRAY | RESPIRATORY_TRACT | 11 refills | Status: DC | PRN

## 2016-07-19 NOTE — Patient Instructions (Addendum)
Please do the ifob stool kit  Check and see if your insurance covers the cologuard for later  Stop at check out for ENT ref  Eat healthy  Exercise Take care of yourself

## 2016-07-19 NOTE — Assessment & Plan Note (Signed)
Pt just took zpak for uri- improved and now worse Nl appearing Some aphthous ulcers in throat  Ref to ENT for ear pain  Uses nasacort

## 2016-07-19 NOTE — Progress Notes (Signed)
Pre visit review using our clinic review tool, if applicable. No additional management support is needed unless otherwise documented below in the visit note. 

## 2016-07-19 NOTE — Progress Notes (Signed)
Subjective:    Patient ID: Ariel Compton, female    DOB: Jul 25, 1956, 60 y.o.   MRN: [id number]  HPI Here for health maintenance exam and to review chronic medical problems    Recently had uri  A lot of pain in R ear remains Finished zpak-got better  She uses nasacort and zyrtec and benadryl   Wt Readings from Last 3 Encounters:  07/19/16 199 lb 8 oz (90.5 kg)  06/28/16 200 lb 1.9 oz (90.8 kg)  04/19/16 212 lb 8 oz (96.4 kg)   bmi is 35 13 lb down from may Tries to avoid cheese - it is hard but is helping  This also helped abd discomfort  Exercise- stairs and uses core ball - stretches    Mammogram 6/17-normal Self breast exam - no lumps or changes   Flu shots- gets them at work every fall   Colon cancer screening -not interested  Wants to do ifob Will also see if cologuard is covered   Had a partial hyst No gyn symptoms   Td 9/10  Neg Hep C and HIV screen -both negative   Hx of hyperlipidemia Lab Results  Component Value Date   CHOL 179 07/06/2016   CHOL 219 (H) 06/30/2015   CHOL 258 (H) 02/28/2014   Lab Results  Component Value Date   HDL 52.50 07/06/2016   HDL 71.90 06/30/2015   HDL 72.50 02/28/2014   Lab Results  Component Value Date   LDLCALC 98 07/06/2016   LDLCALC 112 (H) 06/30/2015   LDLCALC 151 (H) 02/28/2014   Lab Results  Component Value Date   TRIG 145.0 07/06/2016   TRIG 176.0 (H) 06/30/2015   TRIG 175.0 (H) 02/28/2014   Lab Results  Component Value Date   CHOLHDL 3 07/06/2016   CHOLHDL 3 06/30/2015   CHOLHDL 4 02/28/2014   Lab Results  Component Value Date   LDLDIRECT 114.0 01/15/2013   LDLDIRECT 121.4 10/25/2007   LDLDIRECT 121.4 10/23/2007   given up cheese and butter-has improved things    Is on premarin 0.3 mg - surgical menopause  Wants to stay on hormones - she gets really irritable off of it and willing to accept the inc risk of breast cancer   On zoloft for mood - still helps a lot   Results for orders  placed or performed in visit on 07/06/16  CBC with Differential/Platelet  Result Value Ref Range   WBC 6.8 4.0 - 10.5 K/uL   RBC 4.52 3.87 - 5.11 Mil/uL   Hemoglobin 13.3 12.0 - 15.0 g/dL   HCT 39.0 36.0 - 46.0 %   MCV 86.5 78.0 - 100.0 fl   MCHC 33.9 30.0 - 36.0 g/dL   RDW 14.2 11.5 - 15.5 %   Platelets 302.0 150.0 - 400.0 K/uL   Neutrophils Relative % 64.7 43.0 - 77.0 %   Lymphocytes Relative 25.9 12.0 - 46.0 %   Monocytes Relative 6.0 3.0 - 12.0 %   Eosinophils Relative 2.9 0.0 - 5.0 %   Basophils Relative 0.5 0.0 - 3.0 %   Neutro Abs 4.4 1.4 - 7.7 K/uL   Lymphs Abs 1.8 0.7 - 4.0 K/uL   Monocytes Absolute 0.4 0.1 - 1.0 K/uL   Eosinophils Absolute 0.2 0.0 - 0.7 K/uL   Basophils Absolute 0.0 0.0 - 0.1 K/uL  Comprehensive metabolic panel  Result Value Ref Range   Sodium 141 135 - 145 mEq/L   Potassium 3.5 3.5 - 5.1 mEq/L   Chloride  104 96 - 112 mEq/L   CO2 29 19 - 32 mEq/L   Glucose, Bld 81 70 - 99 mg/dL   BUN 16 6 - 23 mg/dL   Creatinine, Ser 0.65 0.40 - 1.20 mg/dL   Total Bilirubin 0.3 0.2 - 1.2 mg/dL   Alkaline Phosphatase 47 39 - 117 U/L   AST 19 0 - 37 U/L   ALT 10 0 - 35 U/L   Total Protein 7.4 6.0 - 8.3 g/dL   Albumin 4.0 3.5 - 5.2 g/dL   Calcium 9.5 8.4 - 10.5 mg/dL   GFR 99.29 >60.00 mL/min  Lipid panel  Result Value Ref Range   Cholesterol 179 0 - 200 mg/dL   Triglycerides 145.0 0.0 - 149.0 mg/dL   HDL 52.50 >39.00 mg/dL   VLDL 29.0 0.0 - 40.0 mg/dL   LDL Cholesterol 98 0 - 99 mg/dL   Total CHOL/HDL Ratio 3    NonHDL 126.85   TSH  Result Value Ref Range   TSH 3.47 0.35 - 4.50 uIU/mL   glucose is 81    Patient Active Problem List   Diagnosis Date Noted  . Otalgia of right ear 07/19/2016  . RUQ abdominal pain 04/19/2016  . Elevated liver enzymes 07/07/2015  . Screening for HIV (human immunodeficiency virus) 07/07/2015  . Colon cancer screening 01/22/2013  . Routine general medical examination at a health care facility 11/11/2012  . Other screening  mammogram 07/23/2012  . Postmenopausal HRT (hormone replacement therapy) 07/07/2011  . DUPUYTREN'S CONTRACTURE 03/16/2010  . MENOPAUSE, SURGICAL 09/04/2009  . ASTHMA, PERSISTENT, MILD 08/11/2008  . HYPERLIPIDEMIA 12/04/2007  . DEPRESSION 12/04/2007  . ALLERGIC RHINITIS 12/04/2007  . BLADDER PROLAPSE 12/04/2007  . Obesity 10/22/2007   Past Medical History:  Diagnosis Date  . Allergic rhinitis   . Asthma    mild  . Contracture of palmar fascia   . Depression   . Mixed hyperlipidemia   . Obesity, unspecified   . Plantar fasciitis   . Symptomatic states associated with artificial menopause   . Unspecified disorder of bladder    Past Surgical History:  Procedure Laterality Date  . VAGINAL HYSTERECTOMY  2008   Enterocele repair   Social History  Substance Use Topics  . Smoking status: Never Smoker  . Smokeless tobacco: Never Used  . Alcohol use No   Family History  Problem Relation Age of Onset  . Peripheral vascular disease Father     Smoker  . Prostate cancer Father   . Heart failure Father   . Other Father     Heart "problems"  . Healthy Mother    Allergies  Allergen Reactions  . Amoxicillin     REACTION: GI UPSET  . Prednisone     Chills, cough   Current Outpatient Prescriptions on File Prior to Visit  Medication Sig Dispense Refill  . ADVAIR DISKUS 100-50 MCG/DOSE AEPB INHALE 1 PUFF INTO THE LUNGS AS NEEDED AS DIRECTED 60 each 5  . azithromycin (ZITHROMAX) 250 MG tablet Take 2 tablets by mouth today, then 1 tablet daily for 4 additional days. 6 tablet 0  . estrogens, conjugated, (PREMARIN) 0.3 MG tablet Take 1 by mouth daily 30 tablet 11  . metaxalone (SKELAXIN) 800 MG tablet take 1 tablet by mouth three times a day if needed for muscle spasm 30 tablet 5  . Multiple Vitamin (MULTIVITAMIN) tablet Take 1 tablet by mouth daily.       No current facility-administered medications on file prior to visit.  Review of Systems Review of Systems  Constitutional:  Negative for fever, appetite change, fatigue and unexpected weight change.  Eyes: Negative for pain and visual disturbance.  ENT pos for ear pain  Respiratory: Negative for cough and shortness of breath.   Cardiovascular: Negative for cp or palpitations    Gastrointestinal: Negative for nausea, diarrhea and constipation.  Genitourinary: Negative for urgency and frequency.  Skin: Negative for pallor or rash   Neurological: Negative for weakness, light-headedness, numbness and headaches.  Hematological: Negative for adenopathy. Does not bruise/bleed easily.  Psychiatric/Behavioral: Negative for dysphoric mood. The patient is not nervous/anxious.         Objective:   Physical Exam  Constitutional: She appears well-developed and well-nourished. No distress.  obese and well appearing   HENT:  Head: Normocephalic and atraumatic.  Right Ear: External ear normal.  Left Ear: External ear normal.  Mouth/Throat: Oropharynx is clear and moist.  apthous ulcers in throat   Nl appearing TMs w/o effusion No sinus tenderness Nares are boggy  Eyes: Conjunctivae and EOM are normal. Pupils are equal, round, and reactive to light. No scleral icterus.  Neck: Normal range of motion. Neck supple. No JVD present. Carotid bruit is not present. No thyromegaly present.  Cardiovascular: Normal rate, regular rhythm, normal heart sounds and intact distal pulses.  Exam reveals no gallop.   Pulmonary/Chest: Effort normal and breath sounds normal. No respiratory distress. She has no wheezes. She exhibits no tenderness.  Abdominal: Soft. Bowel sounds are normal. She exhibits no distension, no abdominal bruit and no mass. There is no tenderness.  Genitourinary: No breast swelling, tenderness, discharge or bleeding.  Musculoskeletal: Normal range of motion. She exhibits no edema or tenderness.  Lymphadenopathy:    She has no cervical adenopathy.  Neurological: She is alert. She has normal reflexes. No cranial nerve  deficit. She exhibits normal muscle tone. Coordination normal.  Skin: Skin is warm and dry. No rash noted. No erythema. No pallor.  Psychiatric: She has a normal mood and affect.          Assessment & Plan:   Problem List Items Addressed This Visit      Other   Routine general medical examination at a health care facility - Primary    Reviewed health habits including diet and exercise and skin cancer prevention Reviewed appropriate screening tests for age  Also reviewed health mt list, fam hx and immunization status , as well as social and family history   See hPI Given ifob card and she will see if cologuard is covered by her insurance       Postmenopausal HRT (hormone replacement therapy)    Pt chooses to stay on premarin for vasomotor and mood symptoms (s/p hysterectomy) She voices understanding of risks such as blood clots and breast cancer      Otalgia of right ear    Pt just took zpak for uri- improved and now worse Nl appearing Some aphthous ulcers in throat  Ref to ENT for ear pain  Uses nasacort       Relevant Orders   Ambulatory referral to ENT   Obesity    Discussed how this problem influences overall health and the risks it imposes  Reviewed plan for weight loss with lower calorie diet (via better food choices and also portion control or program like weight watchers) and exercise building up to or more than 30 minutes 5 days per week including some aerobic activity  HYPERLIPIDEMIA    Disc goals for lipids and reasons to control them Rev labs with pt Rev low sat fat diet in detail Improved with red of high fat dairy in the diet        Colon cancer screening    ifob kit given  She will also check on coverage of cologuard in the future  Declines colonoscopy      Relevant Orders   Fecal occult blood, imunochemical    Other Visit Diagnoses   None.

## 2016-07-20 DIAGNOSIS — H903 Sensorineural hearing loss, bilateral: Secondary | ICD-10-CM | POA: Diagnosis not present

## 2016-07-20 DIAGNOSIS — K120 Recurrent oral aphthae: Secondary | ICD-10-CM | POA: Diagnosis not present

## 2016-07-20 DIAGNOSIS — H9209 Otalgia, unspecified ear: Secondary | ICD-10-CM | POA: Diagnosis not present

## 2016-07-21 NOTE — Assessment & Plan Note (Signed)
Reviewed health habits including diet and exercise and skin cancer prevention Reviewed appropriate screening tests for age  Also reviewed health mt list, fam hx and immunization status , as well as social and family history   See hPI Given ifob card and she will see if cologuard is covered by her insurance

## 2016-07-21 NOTE — Assessment & Plan Note (Signed)
ifob kit given  She will also check on coverage of cologuard in the future  Declines colonoscopy

## 2016-07-21 NOTE — Assessment & Plan Note (Signed)
Disc goals for lipids and reasons to control them Rev labs with pt Rev low sat fat diet in detail Improved with red of high fat dairy in the diet

## 2016-07-21 NOTE — Assessment & Plan Note (Signed)
Pt chooses to stay on premarin for vasomotor and mood symptoms (s/p hysterectomy) She voices understanding of risks such as blood clots and breast cancer

## 2016-07-21 NOTE — Assessment & Plan Note (Signed)
Discussed how this problem influences overall health and the risks it imposes  Reviewed plan for weight loss with lower calorie diet (via better food choices and also portion control or program like weight watchers) and exercise building up to or more than 30 minutes 5 days per week including some aerobic activity    

## 2016-07-23 ENCOUNTER — Encounter: Payer: Self-pay | Admitting: Family Medicine

## 2016-07-30 DIAGNOSIS — J0110 Acute frontal sinusitis, unspecified: Secondary | ICD-10-CM | POA: Diagnosis not present

## 2016-10-16 ENCOUNTER — Other Ambulatory Visit: Payer: Self-pay | Admitting: Family Medicine

## 2016-12-10 ENCOUNTER — Other Ambulatory Visit: Payer: Self-pay | Admitting: Family Medicine

## 2016-12-13 NOTE — Telephone Encounter (Signed)
Pt had CPE on 07/19/16, last filled on 08/10/15 #30 tabs with 5 additional refills, please advise

## 2016-12-13 NOTE — Telephone Encounter (Signed)
Will refill electronically  

## 2016-12-26 DIAGNOSIS — J069 Acute upper respiratory infection, unspecified: Secondary | ICD-10-CM | POA: Diagnosis not present

## 2016-12-26 DIAGNOSIS — R0982 Postnasal drip: Secondary | ICD-10-CM | POA: Diagnosis not present

## 2017-02-07 ENCOUNTER — Encounter: Payer: Self-pay | Admitting: Family Medicine

## 2017-02-07 ENCOUNTER — Ambulatory Visit (INDEPENDENT_AMBULATORY_CARE_PROVIDER_SITE_OTHER): Payer: BLUE CROSS/BLUE SHIELD | Admitting: Family Medicine

## 2017-02-07 VITALS — BP 124/82 | HR 67 | Temp 98.2°F | Ht 63.25 in | Wt 189.5 lb

## 2017-02-07 DIAGNOSIS — H9201 Otalgia, right ear: Secondary | ICD-10-CM | POA: Diagnosis not present

## 2017-02-07 DIAGNOSIS — M5481 Occipital neuralgia: Secondary | ICD-10-CM | POA: Diagnosis not present

## 2017-02-07 NOTE — Assessment & Plan Note (Signed)
Pt has chronic pain and hypersensitivity behind R ear and occ neck pain w/o further radiation  Re assuring exam  Nl ENT w/u  Ref to neurol for further eval and tx  May benefit from gabapentin if this is neurological in nature

## 2017-02-07 NOTE — Progress Notes (Signed)
Pre visit review using our clinic review tool, if applicable. No additional management support is needed unless otherwise documented below in the visit note. 

## 2017-02-07 NOTE — Progress Notes (Signed)
Subjective:    Patient ID: Fabio NeighborsAlana A Belling, female    DOB: Nov 24, 1956, 61 y.o.   MRN: [id number]  HPI Here for neck/ear and facial pain   Right side ENT said it was not her ear  Wonders if it is occipital neuralgia   Pain is behind ear in an oval distribution  Not in front of the ear   No pain in temple   Had ear infections in the past   No fever No rash  Has nasal congestion all the time pnd - takes allergy medicines  No hx of shingles   No hx of psoriasis or any other auto immune disease    Patient Active Problem List   Diagnosis Date Noted  . Occipital neuralgia of right side 02/07/2017  . Otalgia of right ear 07/19/2016  . RUQ abdominal pain 04/19/2016  . Elevated liver enzymes 07/07/2015  . Screening for HIV (human immunodeficiency virus) 07/07/2015  . Colon cancer screening 01/22/2013  . Routine general medical examination at a health care facility 11/11/2012  . Other screening mammogram 07/23/2012  . Postmenopausal HRT (hormone replacement therapy) 07/07/2011  . DUPUYTREN'S CONTRACTURE 03/16/2010  . MENOPAUSE, SURGICAL 09/04/2009  . ASTHMA, PERSISTENT, MILD 08/11/2008  . HYPERLIPIDEMIA 12/04/2007  . DEPRESSION 12/04/2007  . ALLERGIC RHINITIS 12/04/2007  . BLADDER PROLAPSE 12/04/2007  . Obesity 10/22/2007   Past Medical History:  Diagnosis Date  . Allergic rhinitis   . Asthma    mild  . Contracture of palmar fascia   . Depression   . Mixed hyperlipidemia   . Obesity, unspecified   . Plantar fasciitis   . Symptomatic states associated with artificial menopause   . Unspecified disorder of bladder    Past Surgical History:  Procedure Laterality Date  . VAGINAL HYSTERECTOMY  2008   Enterocele repair   Social History  Substance Use Topics  . Smoking status: Never Smoker  . Smokeless tobacco: Never Used  . Alcohol use No   Family History  Problem Relation Age of Onset  . Peripheral vascular disease Father     Smoker  . Prostate cancer  Father   . Heart failure Father   . Other Father     Heart "problems"  . Healthy Mother    Allergies  Allergen Reactions  . Amoxicillin     REACTION: GI UPSET  . Prednisone     Chills, cough   Current Outpatient Prescriptions on File Prior to Visit  Medication Sig Dispense Refill  . ADVAIR DISKUS 100-50 MCG/DOSE AEPB INHALE 1 PUFF INTO THE LUNGS AS NEEDED AS DIRECTED 60 each 5  . albuterol (PROVENTIL HFA;VENTOLIN HFA) 108 (90 Base) MCG/ACT inhaler Inhale 2 puffs into the lungs every 4 (four) hours as needed for wheezing or shortness of breath (or cough). 8 g 11  . azithromycin (ZITHROMAX) 250 MG tablet Take 2 tablets by mouth today, then 1 tablet daily for 4 additional days. 6 tablet 0  . metaxalone (SKELAXIN) 800 MG tablet take 1 tablet by mouth three times a day if needed for muscle spasm 30 tablet 5  . Multiple Vitamin (MULTIVITAMIN) tablet Take 1 tablet by mouth daily.      Marland Kitchen. PREMARIN 0.3 MG tablet take 1 tablet by mouth once daily 30 tablet 5  . sertraline (ZOLOFT) 100 MG tablet Take 1.5 tablets (150 mg total) by mouth daily. 135 tablet 3   No current facility-administered medications on file prior to visit.      Review of Systems  Review of Systems  Constitutional: Negative for fever, appetite change, fatigue and unexpected weight change.  Eyes: Negative for pain and visual disturbance.  ENt neg for congestion/ sinus pain / ear drainage or dental issues  Respiratory: Negative for cough and shortness of breath.   Cardiovascular: Negative for cp or palpitations    Gastrointestinal: Negative for nausea, diarrhea and constipation.  Genitourinary: Negative for urgency and frequency.  Skin: Negative for pallor or rash   Neurological: Negative for weakness, light-headedness, numbness and headaches.  Hematological: Negative for adenopathy. Does not bruise/bleed easily.  Psychiatric/Behavioral: Negative for dysphoric mood. The patient is not nervous/anxious.      Objective:     Physical Exam  Constitutional: She is oriented to person, place, and time. She appears well-developed and well-nourished. No distress.  HENT:  Head: Normocephalic and atraumatic.  Right Ear: External ear normal.  Left Ear: External ear normal.  Nose: Nose normal.  Mouth/Throat: Oropharynx is clear and moist. No oropharyngeal exudate.  No sinus tenderness No temporal tenderness  No TMJ tenderness  TMs appear nl   Tender in area of skin posterior to R ear extending to occiput  No rash or swelling or adenopathy  Eyes: Conjunctivae and EOM are normal. Pupils are equal, round, and reactive to light. Right eye exhibits no discharge. Left eye exhibits no discharge. No scleral icterus.  No nystagmus  Neck: Normal range of motion and full passive range of motion without pain. Neck supple. No JVD present. Carotid bruit is not present. No tracheal deviation present. No thyromegaly present.  Cardiovascular: Normal rate, regular rhythm and normal heart sounds.   No murmur heard. Pulmonary/Chest: Effort normal and breath sounds normal. No respiratory distress. She has no wheezes. She has no rales.  Abdominal: Soft. Bowel sounds are normal. She exhibits no distension and no mass. There is no tenderness.  Musculoskeletal: She exhibits no edema or tenderness.  Lymphadenopathy:    She has no cervical adenopathy.  Neurological: She is alert and oriented to person, place, and time. She has normal strength and normal reflexes. She displays no atrophy and no tremor. No cranial nerve deficit or sensory deficit. She exhibits normal muscle tone. She displays a negative Romberg sign. Coordination and gait normal.  No focal cerebellar signs   Skin: Skin is warm and dry. No rash noted. No pallor.  Psychiatric: She has a normal mood and affect. Her behavior is normal. Thought content normal.          Assessment & Plan:   Problem List Items Addressed This Visit      Other   Occipital neuralgia of right  side    Pt has chronic pain and hypersensitivity behind R ear and occ neck pain w/o further radiation  Re assuring exam  Nl ENT w/u  Ref to neurol for further eval and tx  May benefit from gabapentin if this is neurological in nature       Relevant Orders   Ambulatory referral to Neurology   Otalgia of right ear    Suspect this is related to posterior /occipital pain -see a/p  For neuro ref  Nl ENT w/u       Other Visit Diagnoses    Otalgia, right ear       Relevant Orders   Ambulatory referral to Neurology

## 2017-02-07 NOTE — Assessment & Plan Note (Signed)
Suspect this is related to posterior /occipital pain -see a/p  For neuro ref  Nl ENT w/u

## 2017-02-07 NOTE — Patient Instructions (Signed)
I would like to have you seen by a neurologist for your pain of the ear and occiput  Try a cool compress or bio freeze  If symptoms suddenly worsen or you develop fever or rash let us know

## 2017-02-13 ENCOUNTER — Telehealth: Payer: Self-pay | Admitting: Family Medicine

## 2017-02-13 NOTE — Telephone Encounter (Signed)
Opened in error

## 2017-02-23 ENCOUNTER — Ambulatory Visit (INDEPENDENT_AMBULATORY_CARE_PROVIDER_SITE_OTHER): Payer: BLUE CROSS/BLUE SHIELD | Admitting: Neurology

## 2017-02-23 ENCOUNTER — Encounter: Payer: Self-pay | Admitting: Neurology

## 2017-02-23 VITALS — BP 128/85 | HR 67 | Ht 64.0 in | Wt 189.8 lb

## 2017-02-23 DIAGNOSIS — M542 Cervicalgia: Secondary | ICD-10-CM

## 2017-02-23 DIAGNOSIS — M5481 Occipital neuralgia: Secondary | ICD-10-CM | POA: Diagnosis not present

## 2017-02-23 DIAGNOSIS — R51 Headache: Secondary | ICD-10-CM

## 2017-02-23 DIAGNOSIS — R519 Headache, unspecified: Secondary | ICD-10-CM

## 2017-02-23 MED ORDER — GABAPENTIN (ONCE-DAILY) 300 & 600 MG PO MISC: 1.0000 | Freq: Every day | ORAL | Status: DC

## 2017-02-23 NOTE — Patient Instructions (Addendum)
Remember to drink plenty of fluid, eat healthy meals and do not skip any meals. Try to eat protein with a every meal and eat a healthy snack such as fruit or nuts in between meals. Try to keep a regular sleep-wake schedule and try to exercise daily, particularly in the form of walking, 20-30 minutes a day, if you can.   As far as your medications are concerned, I would like to suggest: Gralise starter pack  As far as diagnostic testing: MRI brain   I would like to see you back as needed, sooner if we need to. Please call us with any interim questions, concerns, problems, updates or refill requests.   Our phone number is [phone]. We also have an after hours call service for urgent matters and there is a physician on-call for urgent questions. For any emergencies you know to call 911 or go to the nearest emergency room  Occipital Neuralgia Occipital neuralgia is a type of headache that causes episodes of very bad pain in the back of your head. Pain from occipital neuralgia may spread (radiate) to other parts of your head. The pain is usually brief and often goes away after you rest and relax. These headaches may be caused by irritation of the nerves that leave your spinal cord high up in your neck, just below the base of your skull (occipital nerves). Your occipital nerves transmit sensations from the back of your head, the top of your head, and the areas behind your ears. What are the causes? Occipital neuralgia can occur without any known cause (primary headache syndrome). In other cases, occipital neuralgia is caused by pressure on or irritation of one of the two occipital nerves. Causes of occipital nerve compression or irritation include:  Wear and tear of the vertebrae in the neck (osteoarthritis).  Neck injury.  Disease of the disks that separate the vertebrae.  Tumors.  Gout.  Infections.  Diabetes.  Swollen blood vessels that put pressure on the occipital nerves.  Muscle  spasm in the neck. What are the signs or symptoms? Pain is the main symptom of occipital neuralgia. It usually starts in the back of the head but may also be felt in other areas supplied by the occipital nerves. Pain is usually on one side but may be on both sides. You may have:  Brief episodes of very bad pain that is burning, stabbing, shocking, or shooting.  Pain behind the eye.  Pain triggered by neck movement or hair brushing.  Scalp tenderness.  Aching in the back of the head between episodes of very bad pain. How is this diagnosed? Your health care provider may diagnose occipital neuralgia based on your symptoms and a physical exam. During the exam, the health care provider may push on areas supplied by the occipital nerves to see if they are painful. Some tests may also be done to help in making the diagnosis. These may include:  Imaging studies of the upper spinal cord, such as an MRI or CT scan. These may show compression or spinal cord abnormalities.  Nerve block. You will get an injection of numbing medicine (local anesthetic) near the occipital nerve to see if this relieves pain. How is this treated? Treatment may begin with simple measures, such as:  Rest.  Massage.  Heat.  Over-the-counter pain relievers. If these measures do not work, you may need other treatments, including:  Medicines such as:  Prescription-strength anti-inflammatory medicines.  Muscle relaxants.  Antiseizure medicines.  Antidepressants.  Steroid  injection. This involves injections of local anesthetic and strong anti-inflammatory drugs (steroids).  Pulsed radiofrequency. Wires are implanted to deliver electrical impulses that block pain signals from the occipital nerve.  Physical therapy.  Surgery to relieve nerve pressure. Follow these instructions at home:  Take all medicines as directed by your health care provider.  Avoid activities that cause pain.  Rest when you have an  attack of pain.  Try gentle massage or a heating pad to relieve pain.  Work with a physical therapist to learn stretching exercises you can do at home.  Try a different pillow or sleeping position.  Practice good posture.  Try to stay active. Get regular exercise that does not cause pain. Ask your health care provider to suggest safe exercises for you.  Keep all follow-up visits as directed by your health care provider. This is important. Contact a health care provider if:  Your medicine is not working.  You have new or worsening symptoms. Get help right away if:  You have very bad head pain that is not going away.  You have a sudden change in vision, balance, or speech. This information is not intended to replace advice given to you by your health care provider. Make sure you discuss any questions you have with your health care provider. Document Released: 11/22/2001 Document Revised: 05/05/2016 Document Reviewed: 11/20/2013 Elsevier Interactive Patient Education  2017 [employer].

## 2017-02-23 NOTE — Progress Notes (Signed)
[facility]    Provider:  Dr Lucia Gaskins Referring Provider: Judy Pimple, MD Primary Care Physician:  Roxy Manns, MD  CC:  Occipital neuralgia  HPI:  Ariel Compton is a 61 y.o. female here as a referral from Dr. Milinda Antis for occipital neuralgia. Ear pain started 4 years ago and at one point the occipital nerve. She kept going to the doctor thinking it was ear infections. She has pain starts in the right occipital area, she can;t even lay on it it hurts, tender to the touch, very sensitive. It shoots up the back of her head and gives her a headache. She also has neck pain. The pain radiates from the neck to the right shoulder and up the occipital area. She feels a lot of pressure in the right ear,  her right ear is blocked sometimes with hearing changes. No vision changes. No weakness of the right arm or numbness and tingling in the arm. The pain is intermittent throughout the day, she has tried to raise the screen. She has had physical for 6 weeks 3-4 years ago, tried OTC medications and heat. Heat makes it worse. Aspirin makes it better.   Reviewed notes, labs and imaging from outside physicians, which showed:  Primary care notes. Patient complaining of neck year and facial pain. On the right side. Patient was evaluated by ear nose and throat who said that it was not her ear. Pain is behind the ear in an oval distribution not in front of the ear. No pain in the temple. She has had ear infections in the past. No fever no rash. Has nasal congestion all the time. Exam showed tenderness in the area of the skin posterior to the right ear extending to the occiput. No rash or swelling or adenopathy. Otherwise physical exam and neurologic exam were normal. Diagnosed with occipital neuralgia of the right side due to pain and hypersensitivity behind the right ear and occasional neck pain. Ongoing for 4 years. Skelaxin makes it better.  She can't lay on her head. Can be severe and last all  night and get up to 10/10 pain and she cries.   Reviewed labs CMP and CBC in July 2017 were both normal.  Review of Systems: Patient complains of symptoms per HPI as well as the following symptoms: No CP, no SOB. Pertinent negatives per HPI. All others negative.   Social History   Social History  . Marital status: Married    Spouse name: N/A  . Number of children: 2  . Years of education: 16   Occupational History  . [employer]    Social History Main Topics  . Smoking status: Never Smoker  . Smokeless tobacco: Never Used  . Alcohol use No  . Drug use: No  . Sexual activity: Not on file   Other Topics Concern  . Not on file   Social History Narrative   Lives at home w/ her husband   Right-handed   Caffeine: rare    Family History  Problem Relation Age of Onset  . Peripheral vascular disease Father     Smoker  . Prostate cancer Father   . Heart failure Father   . Other Father     Heart "problems"  . Stroke Mother   . Headache Neg Hx     Past Medical History:  Diagnosis Date  . Allergic rhinitis   . Asthma    mild  . Contracture of palmar fascia   . Depression   .  Headache   . Mixed hyperlipidemia   . Obesity, unspecified   . Plantar fasciitis   . Symptomatic states associated with artificial menopause   . Unspecified disorder of bladder     Past Surgical History:  Procedure Laterality Date  . ANKLE FRACTURE SURGERY Right   . VAGINAL HYSTERECTOMY  2008   Enterocele repair    Current Outpatient Prescriptions  Medication Sig Dispense Refill  . ADVAIR DISKUS 100-50 MCG/DOSE AEPB INHALE 1 PUFF INTO THE LUNGS AS NEEDED AS DIRECTED 60 each 5  . albuterol (PROVENTIL HFA;VENTOLIN HFA) 108 (90 Base) MCG/ACT inhaler Inhale 2 puffs into the lungs every 4 (four) hours as needed for wheezing or shortness of breath (or cough). 8 g 11  . LITHIUM PO Take by mouth. Lithium orotate PO as dementia preventitive    . metaxalone (SKELAXIN) 800 MG tablet take  1 tablet by mouth three times a day if needed for muscle spasm 30 tablet 5  . Multiple Vitamin (MULTIVITAMIN) tablet Take 1 tablet by mouth daily.      Marland Kitchen PREMARIN 0.3 MG tablet take 1 tablet by mouth once daily 30 tablet 5  . sertraline (ZOLOFT) 100 MG tablet Take 1.5 tablets (150 mg total) by mouth daily. 135 tablet 3  . Gabapentin, Once-Daily, (GRALISE STARTER) 300 & 600 MG MISC Take 1 packet by mouth at bedtime. 1 each PACK   No current facility-administered medications for this visit.     Allergies as of 02/23/2017 - Review Complete 02/23/2017  Allergen Reaction Noted  . Amoxicillin    . Prednisone  11/13/2012    Vitals: BP 128/85   Pulse 67   Ht 5\' 4"  (1.626 m)   Wt 189 lb 12.8 oz (86.1 kg)   BMI 32.58 kg/m  Last Weight:  Wt Readings from Last 1 Encounters:  02/23/17 189 lb 12.8 oz (86.1 kg)   Last Height:   Ht Readings from Last 1 Encounters:  02/23/17 5\' 4"  (1.626 m)    Physical exam: Exam: Gen: NAD, conversant, well nourised, obese, well groomed                     CV: RRR, no MRG. No Carotid Bruits. No peripheral edema, warm, nontender Eyes: Conjunctivae clear without exudates or hemorrhage  Neuro: Detailed Neurologic Exam  Speech:    Speech is normal; fluent and spontaneous with normal comprehension.  Cognition:    The patient is oriented to person, place, and time;     recent and remote memory intact;     language fluent;     normal attention, concentration,     fund of knowledge Cranial Nerves:    The pupils are equal, round, and reactive to light. The fundi are normal and spontaneous venous pulsations are present. Visual fields are full to finger confrontation. Extraocular movements are intact. Trigeminal sensation is intact and the muscles of mastication are normal. The face is symmetric. The palate elevates in the midline. Hearing intact. Voice is normal. Shoulder shrug is normal. The tongue has normal motion without fasciculations.   Coordination:     Normal finger to nose and heel to shin. Normal rapid alternating movements.   Gait:    Heel-toe and tandem gait are normal.   Motor Observation:    No asymmetry, no atrophy, and no involuntary movements noted. Tone:    Normal muscle tone.    Posture:    Posture is normal. normal erect    Strength:    Strength  is V/V in the upper and lower limbs.      Sensation: intact to LT     Reflex Exam:  DTR's:    Deep tendon reflexes in the upper and lower extremities are normal bilaterally.   Toes:    The toes are downgoing bilaterally.   Clonus:    Clonus is absent.    Assessment/Plan:  61 year old with occipital neuralgia and neck pain. Neuro exam in non focal. Pain is right-sided and is located in the distribution of the greater, lesser and/or third occipital nerves, paroxysmal and brief, painful, sharp, with tenderness and trigger points at the emergence of the greater and lesser occipital nerve. Differential includes pain in the upper cervical joints or disks, suboccipital or upper posterior neck muscles including the traps/scm, spinal and posterior cranial fossa dura mater, vertebral arteries, structural and infiltrative lesions such as meningioma, schwannoma, myelitis, compressive disk disease and others. Will need MRI of the brain to rule out lesion given her associated ear pain and hearing changes on the right.  Performed occipital nerve blocks today Start gabapentin ER   Discussed:To prevent or relieve headaches, try the following: Cool Compress. Lie down and place a cool compress on your head.  Avoid headache triggers. If certain foods or odors seem to have triggered your migraines in the past, avoid them. A headache diary might help you identify triggers.  Include physical activity in your daily routine. Try a daily walk or other moderate aerobic exercise.  Manage stress. Find healthy ways to cope with the stressors, such as delegating tasks on your to-do list.  Practice  relaxation techniques. Try deep breathing, yoga, massage and visualization.  Eat regularly. Eating regularly scheduled meals and maintaining a healthy diet might help prevent headaches. Also, drink plenty of fluids.  Follow a regular sleep schedule. Sleep deprivation might contribute to headaches Consider biofeedback. With this mind-body technique, you learn to control certain bodily functions - such as muscle tension, heart rate and blood pressure - to prevent headaches or reduce headache pain.    Proceed to emergency room if you experience new or worsening symptoms or symptoms do not resolve, if you have new neurologic symptoms or if headache is severe, or for any concerning symptom.      NERVE BLOCK PROCEDURE NOTE  History:   Procedure: Patient was consented for right occipital nerve blco. A solution containing ingredients below was prepared in a 3-CC syringe with 27 gauge 1/2 inch needle.   6 Target areas in the suboccipital and occipital regions were identified via palpation and pain response.The sites junctions were sterilized with alcohol wipes. 1ml was injected at each site. The contents of each syringe was injected in a fanlike fashion. The headache improved from 6/10 to 1/10. Patient tolerated the procedure well and no complications were noted.    Consent was provided below and patient acknowledged understanding:   Depo[facility]Medrol 80mg /ml [facility] [id number]  Expiration date: 06/2017 Lot number: [id number]  Bupivicaine 50mg /4410ml NDC [id number]  Expiration date: 05/2018 Lot number: [id number]  Lidocaine 2% NDC [id number]  Expiration date: 2/21 Lot number: [id number]   What to expect afterwards?  Immediately after the injection, the back of your head may feel warm and numb. You may also experience reduction in the pain. The local anaesthetic wears off in a few hours and the steroid usually takes  3-7 days to take effect.   The pain relief is vary variable and can last from a few  days to several months. Some  patients do not experience any pain relief. Hence it is difficult to predict the outcome of the injection treatment in a particular patient.   There may be some discomfort at the injection site for a couple of days after treatment, however, this should settle quite quickly. We advise you to take things easy for the rest of the day. Continue taking your pain medication as advised by your consultant or until you feel benefit from the treatment.   What are the side effects / complications?  Common   Soreness / bruising at the injection site.   Temporary increase (up to 7 days) in pain following procedure.   Rare   Bleeding   Infection at the injection site   Allergic reaction   New pain   Worsening pain         Naomie Dean, MD  [facility] [address] [place], [place] [address]  Phone [phone] Fax [phone]

## 2017-02-24 ENCOUNTER — Telehealth: Payer: Self-pay | Admitting: *Deleted

## 2017-02-24 LAB — BASIC METABOLIC PANEL
BUN / CREAT RATIO: 23 (ref 9–23)
BUN: 16 mg/dL (ref 6–24)
CALCIUM: 9.3 mg/dL (ref 8.7–10.2)
CHLORIDE: 103 mmol/L (ref 96–106)
CO2: 30 mmol/L — ABNORMAL HIGH (ref 18–29)
Creatinine, Ser: 0.69 mg/dL (ref 0.57–1.00)
GFR, EST AFRICAN AMERICAN: 110 mL/min/{1.73_m2} (ref >59)
GFR, EST NON AFRICAN AMERICAN: 96 mL/min/{1.73_m2} (ref >59)
Glucose: 86 mg/dL (ref 65–99)
POTASSIUM: 5.2 mmol/L (ref 3.5–5.2)
Sodium: 143 mmol/L (ref 134–144)

## 2017-02-24 NOTE — Telephone Encounter (Signed)
LVM with number advising office is now closed. Please call back Monday for results.

## 2017-02-26 ENCOUNTER — Encounter: Payer: Self-pay | Admitting: Neurology

## 2017-03-01 ENCOUNTER — Telehealth: Payer: Self-pay | Admitting: *Deleted

## 2017-03-01 NOTE — Telephone Encounter (Signed)
LVM, per Dr Lucia GaskinsAhern, informing patient her lab results are unremarkable. Advised her results are available on her My chart as well. Left number for any questions.

## 2017-03-05 ENCOUNTER — Encounter: Payer: Self-pay | Admitting: Neurology

## 2017-03-07 ENCOUNTER — Other Ambulatory Visit: Payer: Self-pay | Admitting: Neurology

## 2017-03-07 MED ORDER — GABAPENTIN (ONCE-DAILY) 300 & 600 MG PO MISC: 1.0000 | Freq: Every day | ORAL | Status: DC

## 2017-03-07 NOTE — Telephone Encounter (Signed)
Patient enrollment form completed, signed and faxed to [facility] F # [phone].

## 2017-03-09 ENCOUNTER — Other Ambulatory Visit: Payer: Self-pay | Admitting: Neurology

## 2017-03-09 MED ORDER — GABAPENTIN 300 MG PO CAPS: 300.0000 mg | ORAL_CAPSULE | Freq: Three times a day (TID) | ORAL | 2 refills | Status: DC

## 2017-03-09 NOTE — Telephone Encounter (Signed)
[facility] [address] [place]. The other Rx did not get there but she just wants regular called in.

## 2017-03-09 NOTE — Addendum Note (Signed)
Addended by: Donnelly AngelicaHOGAN, Minyon Billiter L on: 03/09/2017 09:49 AM   Modules accepted: Orders

## 2017-03-15 ENCOUNTER — Encounter: Payer: Self-pay | Admitting: Neurology

## 2017-03-15 MED ORDER — GABAPENTIN 300 MG PO CAPS: ORAL_CAPSULE | ORAL | 11 refills | Status: DC

## 2017-04-01 DIAGNOSIS — H5213 Myopia, bilateral: Secondary | ICD-10-CM | POA: Diagnosis not present

## 2017-04-11 ENCOUNTER — Telehealth: Payer: Self-pay | Admitting: Neurology

## 2017-04-11 ENCOUNTER — Encounter: Payer: Self-pay | Admitting: Neurology

## 2017-04-11 ENCOUNTER — Other Ambulatory Visit: Payer: Self-pay | Admitting: Neurology

## 2017-04-11 NOTE — Telephone Encounter (Signed)
Last month, pt requested refills on regular gabapentin instead of taking Gralise. MyChart mssg sent asking pt preference prior to sending in refills.

## 2017-04-11 NOTE — Telephone Encounter (Signed)
Ariel Bowens, do you mind calling patient and asking how she is taking the Gralise or if she is taking Gabapentin IR? She sent me an email saying she takes the gabapentin 3x a day but Gralise is once daily. It is fine to increase to  a day but want to make sure I give her the right medication thanks

## 2017-04-11 NOTE — Telephone Encounter (Signed)
Pt was seen in March for occipital neuralgia and received nerve block. Has an appt scheduled w/ [facility] in June.

## 2017-04-12 MED ORDER — GABAPENTIN 300 MG PO CAPS: 600.0000 mg | ORAL_CAPSULE | Freq: Three times a day (TID) | ORAL | 11 refills | Status: DC

## 2017-04-12 NOTE — Telephone Encounter (Signed)
New rx e-scribed for gabapentin 600 mg TID.

## 2017-04-12 NOTE — Addendum Note (Signed)
Addended by: Donnelly Angelica on: 04/12/2017 08:05 AM   Modules accepted: Orders

## 2017-04-13 ENCOUNTER — Other Ambulatory Visit: Payer: Self-pay | Admitting: Family Medicine

## 2017-04-13 NOTE — Telephone Encounter (Signed)
Pt left v/m requesting status of premarin refill; per DPR left v/m on cell phone refill already done and pt should ck with pharmacy.

## 2017-04-18 DIAGNOSIS — M720 Palmar fascial fibromatosis [Dupuytren]: Secondary | ICD-10-CM | POA: Diagnosis not present

## 2017-05-15 ENCOUNTER — Other Ambulatory Visit: Payer: Self-pay | Admitting: Family Medicine

## 2017-05-15 DIAGNOSIS — Z1231 Encounter for screening mammogram for malignant neoplasm of breast: Secondary | ICD-10-CM

## 2017-05-29 ENCOUNTER — Telehealth: Payer: Self-pay | Admitting: Neurology

## 2017-05-29 NOTE — Telephone Encounter (Signed)
I rec'd appt request from Angie. Pt request to schedule an early am or last appt of the day to rec injection for recurrence of severe HA and pain from occipital neuralgia.

## 2017-05-29 NOTE — Telephone Encounter (Signed)
Yes you can offer her an appt Wednesday morning or a 430 appt

## 2017-05-29 NOTE — Telephone Encounter (Signed)
Called and offered pt appt Wed am at 8:30. She may call back tomorrow to schedule.

## 2017-06-05 ENCOUNTER — Encounter: Payer: Self-pay | Admitting: Neurology

## 2017-06-06 ENCOUNTER — Telehealth: Payer: Self-pay | Admitting: Neurology

## 2017-06-06 NOTE — Telephone Encounter (Signed)
Message   Appointment Request From: Ariel Compton    With Provider: Anson FretAhern, Antonia B, MD [[facility]]    Preferred Date Range: From 07/03/2017 To 07/07/2017    Preferred Times: Any    Reason for visit: Office Visit    Comments:  Please schedule a visit with Dr Lucia GaskinsAhern for me the week of July 23-27 either first thing in the morning or the last appointment of the day, Thankyou.Tomma LightningAlana Colleran [phone]

## 2017-06-07 ENCOUNTER — Ambulatory Visit
Admission: RE | Admit: 2017-06-07 | Discharge: 2017-06-07 | Disposition: A | Discharge status: Home / Self Care | Payer: BLUE CROSS/BLUE SHIELD | Source: Ambulatory Visit | Attending: Family Medicine | Admitting: Family Medicine

## 2017-06-07 DIAGNOSIS — Z1231 Encounter for screening mammogram for malignant neoplasm of breast: Secondary | ICD-10-CM

## 2017-06-09 ENCOUNTER — Other Ambulatory Visit: Payer: BLUE CROSS/BLUE SHIELD

## 2017-06-16 ENCOUNTER — Encounter: Payer: BLUE CROSS/BLUE SHIELD | Admitting: Family Medicine

## 2017-07-03 ENCOUNTER — Ambulatory Visit (INDEPENDENT_AMBULATORY_CARE_PROVIDER_SITE_OTHER): Payer: BLUE CROSS/BLUE SHIELD | Admitting: Neurology

## 2017-07-03 ENCOUNTER — Encounter: Payer: Self-pay | Admitting: Neurology

## 2017-07-03 ENCOUNTER — Telehealth: Payer: Self-pay | Admitting: Neurology

## 2017-07-03 VITALS — BP 123/84 | HR 62 | Ht 64.0 in | Wt 197.0 lb

## 2017-07-03 DIAGNOSIS — R519 Headache, unspecified: Secondary | ICD-10-CM

## 2017-07-03 DIAGNOSIS — G8929 Other chronic pain: Secondary | ICD-10-CM

## 2017-07-03 DIAGNOSIS — H9201 Otalgia, right ear: Secondary | ICD-10-CM | POA: Diagnosis not present

## 2017-07-03 DIAGNOSIS — M542 Cervicalgia: Secondary | ICD-10-CM | POA: Diagnosis not present

## 2017-07-03 DIAGNOSIS — R51 Headache: Secondary | ICD-10-CM | POA: Diagnosis not present

## 2017-07-03 DIAGNOSIS — G43701 Chronic migraine without aura, not intractable, with status migrainosus: Secondary | ICD-10-CM | POA: Diagnosis not present

## 2017-07-03 NOTE — Telephone Encounter (Signed)
Ariel Burtonmily, do we have room on the MRI truck Wednesday for this patient? Let me know,  thanks

## 2017-07-03 NOTE — Progress Notes (Signed)
[facility]    Provider:  Dr Ariel Compton Referring Provider: [name], [name], MD Primary Care Physician:  [name], [name], MD  CC:  Occipital neuralgia vs migraines  Interval history 07/03/2017: She has headaches for over 10 years. She has daily headaches and her ear hurts. Headaches are bilateral, can be pounding and throbbing, she has light sensitivity and has to wear sun glasses at work. She takes 500mg  aspirin every day. She has nausea sometimes with the headaches. She was an ED nurse. Discussed the patient could be suffering from migraines. Also discussed that taking this much aspirin daily could cause increased risk of bleeding, and possibly rebound headaches. Discussed occipital neuralgia versus migraines. Discussed other options of treatment including other preventative and acute management, stopping daily high doses of aspirin which can also be ototoxic, other preventative oral medications as well as Botox in the new CGRP class of medication. Provided patient with documentation from American headache Society on multiple topics having to do with headaches. We'll perform nerve blocks today.  HPI:  Ariel Compton is Compton 61 y.o. female here as Compton referral from Ariel Compton for occipital neuralgia. Ear pain started 4 years ago and at one point the occipital nerve. She kept going to the doctor thinking it was ear infections. She has pain starts in the right occipital area, she can;t even lay on it it hurts, tender to the touch, very sensitive. It shoots up the back of her head and gives her Compton headache. She also has neck pain. The pain radiates from the neck to the right shoulder and up the occipital area. She feels Compton lot of pressure in the right ear,  her right ear is blocked sometimes with hearing changes. No vision changes. No weakness of the right arm or numbness and tingling in the arm. The pain is intermittent throughout the day, she has tried to raise the screen. She has had physical  for 6 weeks 3-4 years ago, tried OTC medications and heat. Heat makes it worse. Aspirin makes it better.   Reviewed notes, labs and imaging from outside physicians, which showed:  Primary care notes. Patient complaining of neck year and facial pain. On the right side. Patient was evaluated by ear nose and throat who said that it was not her ear. Pain is behind the ear in an oval distribution not in front of the ear. No pain in the temple. She has had ear infections in the past. No fever no rash. Has nasal congestion all the time. Exam showed tenderness in the area of the skin posterior to the right ear extending to the occiput. No rash or swelling or adenopathy. Otherwise physical exam and neurologic exam were normal. Diagnosed with occipital neuralgia of the right side due to pain and hypersensitivity behind the right ear and occasional neck pain. Ongoing for 4 years. Skelaxin makes it better.  She can't lay on her head. Can be severe and last all night and get up to 10/10 pain and she cries.   Reviewed labs CMP and CBC in July 2017 were both normal.  Review of Systems: Patient complains of symptoms per HPI as well as the following symptoms: No CP, no SOB. Pertinent negatives per HPI. All others negative.   Social History   Social History  . Marital status: Married    Spouse name: N/Compton  . Number of children: 2  . Years of education: 16   Occupational History  . [employer]  Social History Main Topics  . Smoking status: Never Smoker  . Smokeless tobacco: Never Used  . Alcohol use No  . Drug use: No  . Sexual activity: Not on file   Other Topics Concern  . Not on file   Social History Narrative   Lives at home w/ her husband   Right-handed   Caffeine: rare    Family History  Problem Relation Age of Onset  . Peripheral vascular disease Father        Smoker  . Prostate cancer Father   . Heart failure Father   . Other Father        Heart "problems"  . Stroke  Mother   . Headache Neg Hx   . Breast cancer Neg Hx     Past Medical History:  Diagnosis Date  . Allergic rhinitis   . Asthma    mild  . Contracture of palmar fascia   . Depression   . Headache   . Mixed hyperlipidemia   . Obesity, unspecified   . Plantar fasciitis   . Symptomatic states associated with artificial menopause   . Unspecified disorder of bladder     Past Surgical History:  Procedure Laterality Date  . ANKLE FRACTURE SURGERY Right   . VAGINAL HYSTERECTOMY  2008   Enterocele repair    Current Outpatient Prescriptions  Medication Sig Dispense Refill  . ADVAIR DISKUS 100-50 MCG/DOSE AEPB INHALE 1 PUFF INTO THE LUNGS AS NEEDED AS DIRECTED 60 each 5  . albuterol (PROVENTIL HFA;VENTOLIN HFA) 108 (90 Base) MCG/ACT inhaler Inhale 2 puffs into the lungs every 4 (four) hours as needed for wheezing or shortness of breath (or cough). 8 g 11  . gabapentin (NEURONTIN) 300 MG capsule Take 2 capsules (600 mg total) by mouth 3 (three) times daily. 180 capsule 11  . LITHIUM PO Take by mouth. Lithium orotate PO as dementia preventitive    . metaxalone (SKELAXIN) 800 MG tablet take 1 tablet by mouth three times Compton day if needed for muscle spasm 30 tablet 5  . Multiple Vitamin (MULTIVITAMIN) tablet Take 1 tablet by mouth daily.      Marland Kitchen PREMARIN 0.3 MG tablet take 1 tablet by mouth once daily 30 tablet 3  . sertraline (ZOLOFT) 100 MG tablet Take 1.5 tablets (150 mg total) by mouth daily. 135 tablet 3   No current facility-administered medications for this visit.     Allergies as of 07/03/2017 - Review Complete 07/03/2017  Allergen Reaction Noted  . Amoxicillin    . Prednisone  11/13/2012    Vitals: BP 123/84   Pulse 62   Ht 5\' 4"  (1.626 m)   Wt 197 lb (89.4 kg)   BMI 33.81 kg/m  Last Weight:  Wt Readings from Last 1 Encounters:  07/03/17 197 lb (89.4 kg)   Last Height:   Ht Readings from Last 1 Encounters:  07/03/17 5\' 4"  (1.626 m)     Assessment/Plan:  61 year  old with occipital neuralgia and neck pain. Neuro exam in non focal. Pain is right-sided and is located in the distribution of the greater, lesser and/or third occipital nerves, paroxysmal and brief, painful, sharp, with tenderness and trigger points at the emergence of the greater and lesser occipital nerve. Differential includes pain in the upper cervical joints or disks, suboccipital or upper posterior neck muscles including the traps/scm, spinal and posterior cranial fossa dura mater, vertebral arteries, structural and infiltrative lesions such as meningioma, schwannoma, myelitis, compressive disk disease and  others.   - Will need MRI of the brain to rule out lesion given her associated ear pain and hearing changes on the right. Also patient is over 50 with worsening new onset headaches, we do evaluate for space-occupying lesion. -  Performed nerve blocks today - Continue gabapentin - Discussed the possibility of chronic migraines in this patient, preventative and acute treatments, provided documentation for patient to read on headaches and migraines.  Discussed: To prevent or relieve headaches, try the following: Cool Compress. Lie down and place Compton cool compress on your head.  Avoid headache triggers. If certain foods or odors seem to have triggered your migraines in the past, avoid them. Compton headache diary might help you identify triggers.  Include physical activity in your daily routine. Try Compton daily walk or other moderate aerobic exercise.  Manage stress. Find healthy ways to cope with the stressors, such as delegating tasks on your to-do list.  Practice relaxation techniques. Try deep breathing, yoga, massage and visualization.  Eat regularly. Eating regularly scheduled meals and maintaining Compton healthy diet might help prevent headaches. Also, drink plenty of fluids.  Follow Compton regular sleep schedule. Sleep deprivation might contribute to headaches Consider biofeedback. With this mind-body  technique, you learn to control certain bodily functions - such as muscle tension, heart rate and blood pressure - to prevent headaches or reduce headache pain.    Proceed to emergency room if you experience new or worsening symptoms or symptoms do not resolve, if you have new neurologic symptoms or if headache is severe, or for any concerning symptom.   Provided education and documentation from American headache Society toolbox including articles on: chronic migraine medication overuse headache, chronic migraines, prevention of migraines, behavioral and other nonpharmacologic treatments for headache.  Orders Placed This Encounter  Procedures  . MR BRAIN W WO CONTRAST   Performed by Dr. Lucia Gaskins M.D. All procedures Compton documented below were medically necessary, reasonable and appropriate based on the patient's history, medical diagnosis and physician opinion. Verbal informed consent was obtained from the patient, patient was informed of potential risk of procedure, including bruising, bleeding, hematoma formation, infection, muscle weakness, muscle pain, numbness, transient hypertension, transient hyperglycemia and transient insomnia among others. All areas injected were topically clean with isopropyl rubbing alcohol. Nonsterile nonlatex gloves were worn during the procedure.  1. Greater occipital nerve block [phone]). The greater occipital nerve site was identified at the nuchal line medial to the occipital artery. Medication was injected into the right occipital nerve areas and suboccipital areas. Patient's condition is associated with inflammation of the greater occipital nerve and associated multiple groups. Injection was deemed medically necessary, reasonable and appropriate. Injection represents Compton separate and unique surgical service.  2. Lesser occipital nerve block [phone]). The lesser occipital nerve site was identified approximately 2 cm lateral to the greater occipital nerve. Occasion was injected  into the right occipital nerve areas. Patient's condition is associated with inflammation of the lesser occipital nerve and associated muscle groups. Injection was deemed medically necessary, reasonable and appropriate. Injection represents Compton separate and unique surgical service.  3. Auriculotemporal nerve block ([id number]): The Auriculotemporal nerve site was identified along the posterior margin of the sternocleidomastoid muscle toward the base of the ear. Medication was injected into the right radicular temporal nerve areas. Patient's condition is associated with inflammation of the Auriculotemporal Nerve and associated muscle groups. Injection was deemed medically necessary, reasonable and appropriate. Injection represents Compton separate and unique surgical service.   Naomie Dean, MD  [facility] [address] [place], [place] [address]  Phone [phone] Fax [phone]  Compton total of 25 minutes was spent face-to-face with this patient. Over half this time was spent on counseling patient on the occipital neuralgia versus migraine headache diagnosis, new onset worsening headache after the age of 89, diagnosis and different diagnostic and therapeutic options available.  This does not include time used for nerve blocks.

## 2017-07-03 NOTE — Progress Notes (Signed)
Nerve block:  Xylocaine 2% (20 mg/ml) NDC [id number] Lot [id number] Exp 02/22  Bupivacaine 0.5% (5mg /ml) NDC [id number] BATCH: [id number] Exp: 02/2019  Depo-medrol 80 mg/ml NDC [id number] Lot [id number] Exp 04/2018  Two 3 ml syringes w/ 1 ml marcaine, 1 ml lidocaine and 1 ml depo Two 3 ml syringes w/ 1.5 ml marcaine and 1.5 ml lidocaine

## 2017-07-04 ENCOUNTER — Encounter: Payer: Self-pay | Admitting: Neurology

## 2017-07-04 NOTE — Telephone Encounter (Signed)
The total for her is $1,518.31. And she is going to pay $75.00 tomorrow.

## 2017-07-04 NOTE — Telephone Encounter (Signed)
[facility]. She took the last time slot that I had available for tomorrow.

## 2017-07-04 NOTE — Telephone Encounter (Signed)
THANK YOU!!! What is her copay?

## 2017-07-05 ENCOUNTER — Ambulatory Visit: Payer: BLUE CROSS/BLUE SHIELD

## 2017-07-05 ENCOUNTER — Ambulatory Visit: Payer: Self-pay | Admitting: Neurology

## 2017-07-05 NOTE — Telephone Encounter (Signed)
Carollee HerterShannon with [facility] reached out to the patient and scheduled it for 07/17/17.

## 2017-07-05 NOTE — Telephone Encounter (Signed)
Patient did not know she was claustrophic until today. She could not have the MRI. I faxed the order to [facility] for her to have a bigger machine. Patient is aware of this. I did put on the order phone for them to contact the patient asap.

## 2017-07-13 ENCOUNTER — Telehealth: Payer: Self-pay | Admitting: Family Medicine

## 2017-07-13 ENCOUNTER — Telehealth: Payer: Self-pay | Admitting: Neurology

## 2017-07-13 ENCOUNTER — Encounter: Payer: Self-pay | Admitting: Neurology

## 2017-07-13 DIAGNOSIS — Z0000 Encounter for general adult medical examination without abnormal findings: Secondary | ICD-10-CM

## 2017-07-13 NOTE — Telephone Encounter (Signed)
Patient is requesting to have CT done either today or tomorrow, advised we have to get CT authorized thru insurance, per patient they will not pay for it and she is paying out of pocket for it.  Please call

## 2017-07-13 NOTE — Telephone Encounter (Signed)
-----   Message from Alvina Chouerri J Walsh sent at 07/12/2017  5:40 PM EDT ----- Regarding: Lab orders for Friday, 8.3.18 Patient is scheduled for CPX labs, please order future labs, Thanks , Camelia Engerri

## 2017-07-13 NOTE — Addendum Note (Signed)
Addended by: Naomie DeanAHERN, Pattye Meda B on: 07/13/2017 10:41 AM   Modules accepted: Orders

## 2017-07-13 NOTE — Telephone Encounter (Signed)
Patient could not tolerate MRI so I have ordered a CT w/wo thanks

## 2017-07-13 NOTE — Telephone Encounter (Signed)
I called [facility] and spoke to Beckett RidgePatricia and she relayed patient can come next week and be self pay payment.  I called and spoke to patient I gave here telephone number and she is calling today to get her CT scheduled. Patient is aware of cost . Patient understood all details.

## 2017-07-14 ENCOUNTER — Other Ambulatory Visit (INDEPENDENT_AMBULATORY_CARE_PROVIDER_SITE_OTHER): Payer: BLUE CROSS/BLUE SHIELD

## 2017-07-14 ENCOUNTER — Ambulatory Visit
Admission: RE | Admit: 2017-07-14 | Discharge: 2017-07-14 | Disposition: A | Discharge status: Home / Self Care | Payer: BLUE CROSS/BLUE SHIELD | Source: Ambulatory Visit | Attending: Neurology | Admitting: Neurology

## 2017-07-14 ENCOUNTER — Other Ambulatory Visit: Payer: BLUE CROSS/BLUE SHIELD

## 2017-07-14 DIAGNOSIS — R51 Headache: Principal | ICD-10-CM

## 2017-07-14 DIAGNOSIS — G8929 Other chronic pain: Secondary | ICD-10-CM

## 2017-07-14 DIAGNOSIS — Z0000 Encounter for general adult medical examination without abnormal findings: Secondary | ICD-10-CM | POA: Diagnosis not present

## 2017-07-14 DIAGNOSIS — H9201 Otalgia, right ear: Secondary | ICD-10-CM

## 2017-07-14 DIAGNOSIS — G43701 Chronic migraine without aura, not intractable, with status migrainosus: Secondary | ICD-10-CM

## 2017-07-14 DIAGNOSIS — M542 Cervicalgia: Secondary | ICD-10-CM

## 2017-07-14 DIAGNOSIS — R519 Headache, unspecified: Secondary | ICD-10-CM

## 2017-07-14 LAB — CBC WITH DIFFERENTIAL/PLATELET
BASOS PCT: 0.9 % (ref 0.0–3.0)
Basophils Absolute: 0.1 10*3/uL (ref 0.0–0.1)
EOS ABS: 0.1 10*3/uL (ref 0.0–0.7)
Eosinophils Relative: 2.2 % (ref 0.0–5.0)
HEMATOCRIT: 39.3 % (ref 36.0–46.0)
HEMOGLOBIN: 13.1 g/dL (ref 12.0–15.0)
LYMPHS PCT: 27.2 % (ref 12.0–46.0)
Lymphs Abs: 1.7 10*3/uL (ref 0.7–4.0)
MCHC: 33.5 g/dL (ref 30.0–36.0)
MCV: 89.9 fl (ref 78.0–100.0)
Monocytes Absolute: 0.3 10*3/uL (ref 0.1–1.0)
Monocytes Relative: 5.2 % (ref 3.0–12.0)
Neutro Abs: 3.9 10*3/uL (ref 1.4–7.7)
Neutrophils Relative %: 64.5 % (ref 43.0–77.0)
Platelets: 280 10*3/uL (ref 150.0–400.0)
RBC: 4.37 Mil/uL (ref 3.87–5.11)
RDW: 13.9 % (ref 11.5–15.5)
WBC: 6.1 10*3/uL (ref 4.0–10.5)

## 2017-07-14 LAB — COMPREHENSIVE METABOLIC PANEL
ALBUMIN: 3.9 g/dL (ref 3.5–5.2)
ALK PHOS: 48 U/L (ref 39–117)
ALT: 11 U/L (ref 0–35)
AST: 16 U/L (ref 0–37)
BILIRUBIN TOTAL: 0.3 mg/dL (ref 0.2–1.2)
BUN: 13 mg/dL (ref 6–23)
CALCIUM: 8.9 mg/dL (ref 8.4–10.5)
CHLORIDE: 106 meq/L (ref 96–112)
CO2: 29 mEq/L (ref 19–32)
CREATININE: 0.59 mg/dL (ref 0.40–1.20)
GFR: 110.65 mL/min (ref >60.00)
Glucose, Bld: 87 mg/dL (ref 70–99)
Potassium: 3.7 mEq/L (ref 3.5–5.1)
Sodium: 141 mEq/L (ref 135–145)
TOTAL PROTEIN: 6.9 g/dL (ref 6.0–8.3)

## 2017-07-14 LAB — TSH: TSH: 3.07 u[IU]/mL (ref 0.35–4.50)

## 2017-07-14 LAB — LIPID PANEL
CHOLESTEROL: 206 mg/dL — AB (ref 0–200)
HDL: 72.9 mg/dL (ref >39.00)
LDL Cholesterol: 108 mg/dL — ABNORMAL HIGH (ref 0–99)
NonHDL: 133.03
TRIGLYCERIDES: 126 mg/dL (ref 0.0–149.0)
Total CHOL/HDL Ratio: 3
VLDL: 25.2 mg/dL (ref 0.0–40.0)

## 2017-07-14 MED ORDER — IOPAMIDOL (ISOVUE-300) INJECTION 61%
75.0000 mL | Freq: Once | INTRAVENOUS | Status: AC | PRN
  Administered 2017-07-14: 75 mL via INTRAVENOUS

## 2017-07-17 ENCOUNTER — Other Ambulatory Visit: Payer: BLUE CROSS/BLUE SHIELD

## 2017-07-17 NOTE — Telephone Encounter (Signed)
[facility].

## 2017-07-21 ENCOUNTER — Ambulatory Visit (INDEPENDENT_AMBULATORY_CARE_PROVIDER_SITE_OTHER): Payer: BLUE CROSS/BLUE SHIELD | Admitting: Family Medicine

## 2017-07-21 ENCOUNTER — Encounter: Payer: Self-pay | Admitting: Family Medicine

## 2017-07-21 VITALS — BP 122/68 | HR 74 | Temp 98.2°F | Ht 63.0 in | Wt 199.2 lb

## 2017-07-21 DIAGNOSIS — Z6834 Body mass index (BMI) 34.0-34.9, adult: Secondary | ICD-10-CM

## 2017-07-21 DIAGNOSIS — M5481 Occipital neuralgia: Secondary | ICD-10-CM | POA: Diagnosis not present

## 2017-07-21 DIAGNOSIS — E782 Mixed hyperlipidemia: Secondary | ICD-10-CM | POA: Diagnosis not present

## 2017-07-21 DIAGNOSIS — Z79890 Hormone replacement therapy (postmenopausal): Secondary | ICD-10-CM

## 2017-07-21 DIAGNOSIS — F341 Dysthymic disorder: Secondary | ICD-10-CM | POA: Diagnosis not present

## 2017-07-21 DIAGNOSIS — Z1211 Encounter for screening for malignant neoplasm of colon: Secondary | ICD-10-CM

## 2017-07-21 DIAGNOSIS — E6609 Other obesity due to excess calories: Secondary | ICD-10-CM | POA: Diagnosis not present

## 2017-07-21 DIAGNOSIS — Z0000 Encounter for general adult medical examination without abnormal findings: Secondary | ICD-10-CM | POA: Diagnosis not present

## 2017-07-21 MED ORDER — FLUTICASONE-SALMETEROL 100-50 MCG/DOSE IN AEPB: INHALATION_SPRAY | RESPIRATORY_TRACT | 11 refills | Status: DC

## 2017-07-21 MED ORDER — ESTROGENS CONJUGATED 0.3 MG PO TABS: 0.3000 mg | ORAL_TABLET | Freq: Every day | ORAL | 11 refills | Status: DC

## 2017-07-21 MED ORDER — SERTRALINE HCL 100 MG PO TABS: 150.0000 mg | ORAL_TABLET | Freq: Every day | ORAL | 3 refills | Status: DC

## 2017-07-21 MED ORDER — ZOLPIDEM TARTRATE 10 MG PO TABS: 10.0000 mg | ORAL_TABLET | Freq: Every evening | ORAL | 0 refills | Status: DC | PRN

## 2017-07-21 MED ORDER — ALBUTEROL SULFATE HFA 108 (90 BASE) MCG/ACT IN AERS: 2.0000 | INHALATION_SPRAY | RESPIRATORY_TRACT | 11 refills | Status: DC | PRN

## 2017-07-21 NOTE — Patient Instructions (Addendum)
Keep exercising (walking)  Try to get your carbohydrates from produce and avoid junk food   Don't forget to get your flu shot in the fall at work  Labs look good

## 2017-07-21 NOTE — Progress Notes (Signed)
Subjective:    Patient ID: Ariel Compton, female    DOB: [birth year], 61 y.o.   MRN: [id number]  HPI Here for health maintenance exam and to review chronic medical problems    Doing ok overall  Dealing with occipital neuralgia   Not getting enough exercise - but she does walk   Wt Readings from Last 3 Encounters:  07/21/17 199 lb 4 oz (90.4 kg)  07/03/17 197 lb (89.4 kg)  02/23/17 189 lb 12.8 oz (86.1 kg)  cut out junk food/eating a lot of salads Fewer sweets  Healthy stuff  35.30 kg/m   Likes bread   Gets flu shot in the fall - at work   Mammogram 6/18 -normal Self breast exam -no lumps   Has had hyserectomy-no gyn problems   Colon cancer screening - declines colonoscopy  Tetanus shot 9/10  Pneumovax 23 was 2/14  Asthma is worse/ this is her bad season / hayfever  Pulse ox 91%, just used her inhaler  Not currently wheezing   Hysterectomy in the past  On HRT  Premarin 0.3 daily  Doing ok  Does not want to stop it -it helps her mood a lot as well as other menopausal symptoms     Hx of depression /dysthymia  On sertraline 100 mg  Mood is good if she stays on it   Hyperlipidemia Lab Results  Component Value Date   CHOL 206 (H) 07/14/2017   CHOL 179 07/06/2016   CHOL 219 (H) 06/30/2015   Lab Results  Component Value Date   HDL 72.90 07/14/2017   HDL 52.50 07/06/2016   HDL 71.90 06/30/2015   Lab Results  Component Value Date   LDLCALC 108 (H) 07/14/2017   LDLCALC 98 07/06/2016   LDLCALC 112 (H) 06/30/2015   Lab Results  Component Value Date   TRIG 126.0 07/14/2017   TRIG 145.0 07/06/2016   TRIG 176.0 (H) 06/30/2015   Lab Results  Component Value Date   CHOLHDL 3 07/14/2017   CHOLHDL 3 07/06/2016   CHOLHDL 3 06/30/2015   Lab Results  Component Value Date   LDLDIRECT 114.0 01/15/2013   LDLDIRECT 121.4 10/25/2007   LDLDIRECT 121.4 10/23/2007  eating did change bit  Results for orders placed or performed in visit on 07/14/17  CBC  with Differential/Platelet  Result Value Ref Range   WBC 6.1 4.0 - 10.5 K/uL   RBC 4.37 3.87 - 5.11 Mil/uL   Hemoglobin 13.1 12.0 - 15.0 g/dL   HCT 40.9 81.1 - 91.4 %   MCV 89.9 78.0 - 100.0 fl   MCHC 33.5 30.0 - 36.0 g/dL   RDW 78.2 95.6 - 21.3 %   Platelets 280.0 150.0 - 400.0 K/uL   Neutrophils Relative % 64.5 43.0 - 77.0 %   Lymphocytes Relative 27.2 12.0 - 46.0 %   Monocytes Relative 5.2 3.0 - 12.0 %   Eosinophils Relative 2.2 0.0 - 5.0 %   Basophils Relative 0.9 0.0 - 3.0 %   Neutro Abs 3.9 1.4 - 7.7 K/uL   Lymphs Abs 1.7 0.7 - 4.0 K/uL   Monocytes Absolute 0.3 0.1 - 1.0 K/uL   Eosinophils Absolute 0.1 0.0 - 0.7 K/uL   Basophils Absolute 0.1 0.0 - 0.1 K/uL  Comprehensive metabolic panel  Result Value Ref Range   Sodium 141 135 - 145 mEq/L   Potassium 3.7 3.5 - 5.1 mEq/L   Chloride 106 96 - 112 mEq/L   CO2 29 19 - 32 mEq/L  Glucose, Bld 87 70 - 99 mg/dL   BUN 13 6 - 23 mg/dL   Creatinine, Ser 1.61 0.40 - 1.20 mg/dL   Total Bilirubin 0.3 0.2 - 1.2 mg/dL   Alkaline Phosphatase 48 39 - 117 U/L   AST 16 0 - 37 U/L   ALT 11 0 - 35 U/L   Total Protein 6.9 6.0 - 8.3 g/dL   Albumin 3.9 3.5 - 5.2 g/dL   Calcium 8.9 8.4 - 09.6 mg/dL   GFR 045.40 >98.11 mL/min  Lipid panel  Result Value Ref Range   Cholesterol 206 (H) 0 - 200 mg/dL   Triglycerides 914.7 0.0 - 149.0 mg/dL   HDL 82.95 >62.13 mg/dL   VLDL 08.6 0.0 - 57.8 mg/dL   LDL Cholesterol 469 (H) 0 - 99 mg/dL   Total CHOL/HDL Ratio 3    NonHDL 133.03   TSH  Result Value Ref Range   TSH 3.07 0.35 - 4.50 uIU/mL      Was diagnosed with occipital neuralgia this year  On gabapentin from neurology -this helps a lot  Had a CT (did not tolerate mri)   Going to [place] in December    Patient Active Problem List   Diagnosis Date Noted  . Occipital neuralgia of right side 02/07/2017  . Elevated liver enzymes 07/07/2015  . Colon cancer screening 01/22/2013  . Routine general medical examination at a health care  facility 11/11/2012  . Other screening mammogram 07/23/2012  . Postmenopausal HRT (hormone replacement therapy) 07/07/2011  . DUPUYTREN'S CONTRACTURE 03/16/2010  . MENOPAUSE, SURGICAL 09/04/2009  . ASTHMA, PERSISTENT, MILD 08/11/2008  . HYPERLIPIDEMIA 12/04/2007  . Depression 12/04/2007  . ALLERGIC RHINITIS 12/04/2007  . BLADDER PROLAPSE 12/04/2007  . Obesity 10/22/2007   Past Medical History:  Diagnosis Date  . Allergic rhinitis   . Asthma    mild  . Contracture of palmar fascia   . Depression   . Headache   . Mixed hyperlipidemia   . Obesity, unspecified   . Plantar fasciitis   . Symptomatic states associated with artificial menopause   . Unspecified disorder of bladder    Past Surgical History:  Procedure Laterality Date  . ANKLE FRACTURE SURGERY Right   . VAGINAL HYSTERECTOMY  2008   Enterocele repair   Social History  Substance Use Topics  . Smoking status: Never Smoker  . Smokeless tobacco: Never Used  . Alcohol use No   Family History  Problem Relation Age of Onset  . Peripheral vascular disease Father        Smoker  . Prostate cancer Father   . Heart failure Father   . Other Father        Heart "problems"  . Stroke Mother   . Headache Neg Hx   . Breast cancer Neg Hx    Allergies  Allergen Reactions  . Amoxicillin     REACTION: GI UPSET  . Prednisone     Chills, cough   Current Outpatient Prescriptions on File Prior to Visit  Medication Sig Dispense Refill  . gabapentin (NEURONTIN) 300 MG capsule Take 2 capsules (600 mg total) by mouth 3 (three) times daily. 180 capsule 11  . metaxalone (SKELAXIN) 800 MG tablet take 1 tablet by mouth three times a day if needed for muscle spasm 30 tablet 5  . Multiple Vitamin (MULTIVITAMIN) tablet Take 1 tablet by mouth daily.       No current facility-administered medications on file prior to visit.  Review of Systems Review of Systems  Constitutional: Negative for fever, appetite change, fatigue and  unexpected weight change.  Eyes: Negative for pain and visual disturbance.  Respiratory: Negative for cough and shortness of breath.   Cardiovascular: Negative for cp or palpitations    Gastrointestinal: Negative for nausea, diarrhea and constipation.  Genitourinary: Negative for urgency and frequency.  Skin: Negative for pallor or rash   Neurological: Negative for weakness, light-headedness, numbness and pos for symptoms of occipital neuralgia  Hematological: Negative for adenopathy. Does not bruise/bleed easily.  Psychiatric/Behavioral: Negative for dysphoric mood. The patient is not nervous/anxious.         Objective:   Physical Exam  Constitutional: She appears well-developed and well-nourished. No distress.  obese and well appearing   HENT:  Head: Normocephalic and atraumatic.  Right Ear: External ear normal.  Left Ear: External ear normal.  Mouth/Throat: Oropharynx is clear and moist.  Eyes: Pupils are equal, round, and reactive to light. Conjunctivae and EOM are normal. No scleral icterus.  Neck: Normal range of motion. Neck supple. No JVD present. Carotid bruit is not present. No thyromegaly present.  Cardiovascular: Normal rate, regular rhythm, normal heart sounds and intact distal pulses.  Exam reveals no gallop.   Pulmonary/Chest: Effort normal and breath sounds normal. No respiratory distress. She has no wheezes. She exhibits no tenderness.  Abdominal: Soft. Bowel sounds are normal. She exhibits no distension, no abdominal bruit and no mass. There is no tenderness.  Genitourinary: No breast swelling, tenderness, discharge or bleeding.  Genitourinary Comments: Breast exam: No mass, nodules, thickening, tenderness, bulging, retraction, inflamation, nipple discharge or skin changes noted.  No axillary or clavicular LA.      Musculoskeletal: Normal range of motion. She exhibits no edema or tenderness.  Lymphadenopathy:    She has no cervical adenopathy.  Neurological: She is  alert. She has normal reflexes. No cranial nerve deficit. She exhibits normal muscle tone. Coordination normal.  Skin: Skin is warm and dry. No rash noted. No erythema. No pallor.  Solar lentigines diffusely   Psychiatric: She has a normal mood and affect.          Assessment & Plan:   Problem List Items Addressed This Visit      Other   Colon cancer screening - Primary    Pt declines colonoscopy  Asked to check on coverage of cologuard      Depression    Doing well with sertraline and HRT  Reviewed stressors/ coping techniques/symptoms/ support sources/ tx options and side effects in detail today  Enc exercise and outdoor time       Relevant Medications   sertraline (ZOLOFT) 100 MG tablet   HYPERLIPIDEMIA    Disc goals for lipids and reasons to control them Rev labs with pt Rev low sat fat diet in detail  Good HDL  LDL 108         Obesity    Discussed how this problem influences overall health and the risks it imposes  Reviewed plan for weight loss with lower calorie diet (via better food choices and also portion control or program like weight watchers) and exercise building up to or more than 30 minutes 5 days per week including some aerobic activity   Commended work on diet so far       Occipital neuralgia of right side    Continues gabapentin from neurology  Neg w/u       Postmenopausal HRT (hormone replacement therapy)  This helps mood and vasomotor symptoms Pt understands risks of HRT including breast cancer and blood clots and chooses to continue  Refilled for the year 0.3 mg premarin      Routine general medical examination at a health care facility    Reviewed health habits including diet and exercise and skin cancer prevention Reviewed appropriate screening tests for age  Also reviewed health mt list, fam hx and immunization status , as well as social and family history   See HPI Labs reviewed  She will get a flu shot in the fall  Enc further  work on wt loss

## 2017-07-22 NOTE — Assessment & Plan Note (Signed)
Reviewed health habits including diet and exercise and skin cancer prevention Reviewed appropriate screening tests for age  Also reviewed health mt list, fam hx and immunization status , as well as social and family history   See HPI Labs reviewed  She will get a flu shot in the fall  Enc further work on wt loss

## 2017-07-22 NOTE — Assessment & Plan Note (Signed)
Pt declines colonoscopy  Asked to check on coverage of cologuard

## 2017-07-22 NOTE — Assessment & Plan Note (Signed)
Doing well with sertraline and HRT  Reviewed stressors/ coping techniques/symptoms/ support sources/ tx options and side effects in detail today  Enc exercise and outdoor time

## 2017-07-22 NOTE — Assessment & Plan Note (Signed)
Continues gabapentin from neurology  Neg w/u

## 2017-07-22 NOTE — Assessment & Plan Note (Signed)
Disc goals for lipids and reasons to control them Rev labs with pt Rev low sat fat diet in detail  Good HDL  LDL 108

## 2017-07-22 NOTE — Assessment & Plan Note (Signed)
Discussed how this problem influences overall health and the risks it imposes  Reviewed plan for weight loss with lower calorie diet (via better food choices and also portion control or program like weight watchers) and exercise building up to or more than 30 minutes 5 days per week including some aerobic activity   Commended work on diet so far

## 2017-07-22 NOTE — Assessment & Plan Note (Signed)
This helps mood and vasomotor symptoms Pt understands risks of HRT including breast cancer and blood clots and chooses to continue  Refilled for the year 0.3 mg premarin

## 2017-08-10 ENCOUNTER — Encounter: Payer: Self-pay | Admitting: Family Medicine

## 2017-08-10 ENCOUNTER — Other Ambulatory Visit: Payer: Self-pay | Admitting: Family Medicine

## 2017-09-18 ENCOUNTER — Encounter: Payer: Self-pay | Admitting: Family Medicine

## 2017-09-21 MED ORDER — ESTROGENS CONJUGATED 0.3 MG PO TABS: 0.3000 mg | ORAL_TABLET | Freq: Every day | ORAL | 11 refills | Status: DC

## 2017-09-26 ENCOUNTER — Encounter: Payer: Self-pay | Admitting: Neurology

## 2017-09-29 ENCOUNTER — Other Ambulatory Visit: Payer: Self-pay | Admitting: Neurology

## 2017-10-02 ENCOUNTER — Other Ambulatory Visit: Payer: Self-pay | Admitting: Neurology

## 2017-10-02 DIAGNOSIS — M542 Cervicalgia: Secondary | ICD-10-CM

## 2017-10-02 MED ORDER — ALPRAZOLAM 0.5 MG PO TABS: ORAL_TABLET | ORAL | 0 refills | Status: DC

## 2017-10-03 DIAGNOSIS — M9901 Segmental and somatic dysfunction of cervical region: Secondary | ICD-10-CM | POA: Diagnosis not present

## 2017-10-03 DIAGNOSIS — M531 Cervicobrachial syndrome: Secondary | ICD-10-CM | POA: Diagnosis not present

## 2017-10-03 DIAGNOSIS — G44219 Episodic tension-type headache, not intractable: Secondary | ICD-10-CM | POA: Diagnosis not present

## 2017-10-03 DIAGNOSIS — M530 Cervicocranial syndrome: Secondary | ICD-10-CM | POA: Diagnosis not present

## 2017-10-06 ENCOUNTER — Encounter: Payer: Self-pay | Admitting: Neurology

## 2017-10-06 DIAGNOSIS — M531 Cervicobrachial syndrome: Secondary | ICD-10-CM | POA: Diagnosis not present

## 2017-10-06 DIAGNOSIS — M9901 Segmental and somatic dysfunction of cervical region: Secondary | ICD-10-CM | POA: Diagnosis not present

## 2017-10-06 DIAGNOSIS — G44219 Episodic tension-type headache, not intractable: Secondary | ICD-10-CM | POA: Diagnosis not present

## 2017-10-06 DIAGNOSIS — M530 Cervicocranial syndrome: Secondary | ICD-10-CM | POA: Diagnosis not present

## 2017-10-10 ENCOUNTER — Other Ambulatory Visit: Payer: Self-pay | Admitting: Neurology

## 2017-10-10 DIAGNOSIS — M531 Cervicobrachial syndrome: Secondary | ICD-10-CM | POA: Diagnosis not present

## 2017-10-10 DIAGNOSIS — G44219 Episodic tension-type headache, not intractable: Secondary | ICD-10-CM | POA: Diagnosis not present

## 2017-10-10 DIAGNOSIS — M530 Cervicocranial syndrome: Secondary | ICD-10-CM | POA: Diagnosis not present

## 2017-10-10 DIAGNOSIS — M9901 Segmental and somatic dysfunction of cervical region: Secondary | ICD-10-CM | POA: Diagnosis not present

## 2017-10-10 MED ORDER — PREGABALIN 150 MG PO CAPS: 150.0000 mg | ORAL_CAPSULE | Freq: Two times a day (BID) | ORAL | 5 refills | Status: DC

## 2017-10-11 ENCOUNTER — Other Ambulatory Visit: Payer: Self-pay | Admitting: Neurology

## 2017-10-17 DIAGNOSIS — M9901 Segmental and somatic dysfunction of cervical region: Secondary | ICD-10-CM | POA: Diagnosis not present

## 2017-10-17 DIAGNOSIS — M531 Cervicobrachial syndrome: Secondary | ICD-10-CM | POA: Diagnosis not present

## 2017-10-17 DIAGNOSIS — M530 Cervicocranial syndrome: Secondary | ICD-10-CM | POA: Diagnosis not present

## 2017-10-17 DIAGNOSIS — G44219 Episodic tension-type headache, not intractable: Secondary | ICD-10-CM | POA: Diagnosis not present

## 2017-10-31 DIAGNOSIS — G44219 Episodic tension-type headache, not intractable: Secondary | ICD-10-CM | POA: Diagnosis not present

## 2017-10-31 DIAGNOSIS — M531 Cervicobrachial syndrome: Secondary | ICD-10-CM | POA: Diagnosis not present

## 2017-10-31 DIAGNOSIS — M9901 Segmental and somatic dysfunction of cervical region: Secondary | ICD-10-CM | POA: Diagnosis not present

## 2017-10-31 DIAGNOSIS — M530 Cervicocranial syndrome: Secondary | ICD-10-CM | POA: Diagnosis not present

## 2017-11-07 DIAGNOSIS — M9901 Segmental and somatic dysfunction of cervical region: Secondary | ICD-10-CM | POA: Diagnosis not present

## 2017-11-07 DIAGNOSIS — M531 Cervicobrachial syndrome: Secondary | ICD-10-CM | POA: Diagnosis not present

## 2017-11-07 DIAGNOSIS — G44219 Episodic tension-type headache, not intractable: Secondary | ICD-10-CM | POA: Diagnosis not present

## 2017-11-07 DIAGNOSIS — M530 Cervicocranial syndrome: Secondary | ICD-10-CM | POA: Diagnosis not present

## 2017-11-08 ENCOUNTER — Encounter: Payer: Self-pay | Admitting: Family Medicine

## 2017-11-13 ENCOUNTER — Encounter: Payer: Self-pay | Admitting: *Deleted

## 2017-11-13 ENCOUNTER — Telehealth: Payer: Self-pay | Admitting: *Deleted

## 2017-11-13 NOTE — Telephone Encounter (Signed)
Pt called back, she is requesting the letter to mailed to her. Address confirmed

## 2017-11-13 NOTE — Telephone Encounter (Signed)
Letter addressed to patient and sent to medical records to be mailed.

## 2017-11-13 NOTE — Telephone Encounter (Signed)
Letter for work regarding recommendation for window written, signed & placed at front desk for patient pickup. Called patient and LVM (ok per DPR) to let her know.

## 2017-11-21 ENCOUNTER — Other Ambulatory Visit: Payer: Self-pay | Admitting: Family Medicine

## 2017-11-21 DIAGNOSIS — M9902 Segmental and somatic dysfunction of thoracic region: Secondary | ICD-10-CM | POA: Diagnosis not present

## 2017-11-21 DIAGNOSIS — M9901 Segmental and somatic dysfunction of cervical region: Secondary | ICD-10-CM | POA: Diagnosis not present

## 2017-11-21 DIAGNOSIS — M531 Cervicobrachial syndrome: Secondary | ICD-10-CM | POA: Diagnosis not present

## 2017-11-22 ENCOUNTER — Encounter: Payer: Self-pay | Admitting: Neurology

## 2017-11-23 NOTE — Telephone Encounter (Signed)
CPE was on 07/21/17, last filled on 12/13/16 #30 tabs with 5 additional refills, please advise

## 2017-11-23 NOTE — Telephone Encounter (Signed)
Will refill electronically  

## 2017-11-23 NOTE — Telephone Encounter (Signed)
ADA form printed. Sent to medical records for proper processing. Patient notified that she will need to pay $50 up front prior to form completion. Also told patient that she will need to fill out a release of information form for HIPAA purposes.

## 2017-11-27 DIAGNOSIS — Z0289 Encounter for other administrative examinations: Secondary | ICD-10-CM

## 2017-11-29 ENCOUNTER — Telehealth: Payer: Self-pay | Admitting: *Deleted

## 2017-11-29 DIAGNOSIS — M9902 Segmental and somatic dysfunction of thoracic region: Secondary | ICD-10-CM | POA: Diagnosis not present

## 2017-11-29 DIAGNOSIS — M531 Cervicobrachial syndrome: Secondary | ICD-10-CM | POA: Diagnosis not present

## 2017-11-29 DIAGNOSIS — M9901 Segmental and somatic dysfunction of cervical region: Secondary | ICD-10-CM | POA: Diagnosis not present

## 2017-11-29 NOTE — Telephone Encounter (Signed)
I called pt, lvm unable to reach pt.  

## 2017-11-30 NOTE — Telephone Encounter (Signed)
ADA form for pt to have window in office/work space completed, signed & sent to medical records for processing.

## 2017-12-01 NOTE — Telephone Encounter (Addendum)
Pt [employer] form mailed out to pt home address.

## 2017-12-13 DIAGNOSIS — M530 Cervicocranial syndrome: Secondary | ICD-10-CM | POA: Diagnosis not present

## 2017-12-13 DIAGNOSIS — M9901 Segmental and somatic dysfunction of cervical region: Secondary | ICD-10-CM | POA: Diagnosis not present

## 2017-12-22 DIAGNOSIS — M531 Cervicobrachial syndrome: Secondary | ICD-10-CM | POA: Diagnosis not present

## 2017-12-22 DIAGNOSIS — M9902 Segmental and somatic dysfunction of thoracic region: Secondary | ICD-10-CM | POA: Diagnosis not present

## 2017-12-22 DIAGNOSIS — M530 Cervicocranial syndrome: Secondary | ICD-10-CM | POA: Diagnosis not present

## 2017-12-22 DIAGNOSIS — M9901 Segmental and somatic dysfunction of cervical region: Secondary | ICD-10-CM | POA: Diagnosis not present

## 2018-01-09 DIAGNOSIS — M531 Cervicobrachial syndrome: Secondary | ICD-10-CM | POA: Diagnosis not present

## 2018-01-09 DIAGNOSIS — M9902 Segmental and somatic dysfunction of thoracic region: Secondary | ICD-10-CM | POA: Diagnosis not present

## 2018-01-09 DIAGNOSIS — M9901 Segmental and somatic dysfunction of cervical region: Secondary | ICD-10-CM | POA: Diagnosis not present

## 2018-01-30 DIAGNOSIS — M9901 Segmental and somatic dysfunction of cervical region: Secondary | ICD-10-CM | POA: Diagnosis not present

## 2018-01-30 DIAGNOSIS — M531 Cervicobrachial syndrome: Secondary | ICD-10-CM | POA: Diagnosis not present

## 2018-01-30 DIAGNOSIS — M9902 Segmental and somatic dysfunction of thoracic region: Secondary | ICD-10-CM | POA: Diagnosis not present

## 2018-02-16 DIAGNOSIS — M9901 Segmental and somatic dysfunction of cervical region: Secondary | ICD-10-CM | POA: Diagnosis not present

## 2018-02-16 DIAGNOSIS — M9902 Segmental and somatic dysfunction of thoracic region: Secondary | ICD-10-CM | POA: Diagnosis not present

## 2018-02-16 DIAGNOSIS — M5386 Other specified dorsopathies, lumbar region: Secondary | ICD-10-CM | POA: Diagnosis not present

## 2018-02-16 DIAGNOSIS — M531 Cervicobrachial syndrome: Secondary | ICD-10-CM | POA: Diagnosis not present

## 2018-02-23 ENCOUNTER — Telehealth: Payer: Self-pay | Admitting: *Deleted

## 2018-02-23 NOTE — Telephone Encounter (Signed)
PA done at [email or web address] for pt's advair inhaler. I will await a response

## 2018-02-26 NOTE — Telephone Encounter (Signed)
Received call from pt's insurance letting me know that the name brand Advair is covered by pt's insurance and that the pharmacy was just trying to run the generic inhaler wixela through and that's why it was saying pt needs a PA. The insurance said they will cancel the PA request and advise us to just tell the pharmacy to run the Rx through as the name brand Advair.  Called pharmacy and left VM letting them know what pt's insurance said regarding the PA and to call us with any questions

## 2018-03-06 DIAGNOSIS — M531 Cervicobrachial syndrome: Secondary | ICD-10-CM | POA: Diagnosis not present

## 2018-03-06 DIAGNOSIS — M9902 Segmental and somatic dysfunction of thoracic region: Secondary | ICD-10-CM | POA: Diagnosis not present

## 2018-03-06 DIAGNOSIS — M9901 Segmental and somatic dysfunction of cervical region: Secondary | ICD-10-CM | POA: Diagnosis not present

## 2018-03-26 ENCOUNTER — Encounter: Payer: Self-pay | Admitting: Family Medicine

## 2018-04-02 NOTE — Telephone Encounter (Signed)
Left VM requesting pt to call the office back, CRM created 

## 2018-04-03 DIAGNOSIS — M531 Cervicobrachial syndrome: Secondary | ICD-10-CM | POA: Diagnosis not present

## 2018-04-03 DIAGNOSIS — M9901 Segmental and somatic dysfunction of cervical region: Secondary | ICD-10-CM | POA: Diagnosis not present

## 2018-04-03 DIAGNOSIS — M9902 Segmental and somatic dysfunction of thoracic region: Secondary | ICD-10-CM | POA: Diagnosis not present

## 2018-04-03 MED ORDER — GABAPENTIN 300 MG PO CAPS: 600.0000 mg | ORAL_CAPSULE | Freq: Three times a day (TID) | ORAL | 5 refills | Status: DC

## 2018-04-03 NOTE — Telephone Encounter (Signed)
[facility] Mil[facility]ower [facility]

## 2018-04-03 NOTE — Telephone Encounter (Signed)
Copied from CRM [id number]#[id number]. Topic: Quick Communication - Office Called Patient >> Apr 02, 2018 12:39 PM Shon MilletWatlington, [place] M, [place] wrote: Reason for CRM: please xfer call back to me (Dr. Royden Purlower's medical assistant) if pt calls back, I need to review pt's med list and confirm which pharmacy to send in a Rx for. (mychart message) >> Apr 03, 2018 12:47 PM Debroah LoopLander, Lumin L wrote: [id number] [address] [facility] [place], [place] - [address] [facility]  [address] [place] [id number]  Phone: [phone] Fax: [phone]   [place]ap[place]:  Patient uses Gabapentin 300mg  2pills 3x per day and she needs this sent for May. She says her med list should be on file as she only receive prescriptions from Dr. Milinda Antisower.  Please call back if any additional questions.

## 2018-04-03 NOTE — Telephone Encounter (Signed)
Px sent

## 2018-04-04 ENCOUNTER — Encounter: Payer: Self-pay | Admitting: Family Medicine

## 2018-04-11 ENCOUNTER — Ambulatory Visit
Admission: EM | Admit: 2018-04-11 | Discharge: 2018-04-11 | Disposition: A | Discharge status: Home / Self Care | Payer: BLUE CROSS/BLUE SHIELD | Attending: Family Medicine | Admitting: Family Medicine

## 2018-04-11 ENCOUNTER — Other Ambulatory Visit: Payer: Self-pay

## 2018-04-11 DIAGNOSIS — J0100 Acute maxillary sinusitis, unspecified: Secondary | ICD-10-CM

## 2018-04-11 MED ORDER — AMOXICILLIN 875 MG PO TABS: 875.0000 mg | ORAL_TABLET | Freq: Two times a day (BID) | ORAL | 0 refills | Status: DC

## 2018-04-11 NOTE — ED Triage Notes (Signed)
Pt with sx x almost a month. Facial pain, headache, teeth pain, and feeling hot & cold

## 2018-04-11 NOTE — ED Provider Notes (Signed)
[facility]    CSN: [id number] Arrival date & time: 04/11/18  1405     History   Chief Complaint Chief Complaint  Patient presents with  . Facial Pain  . Sinusitis    HPI Olympia A Grimme is a 62 y.o. female.   The history is provided by the patient.  Sinusitis  Associated symptoms: congestion and rhinorrhea   Associated symptoms: no wheezing   URI  Presenting symptoms: congestion, facial pain and rhinorrhea   Severity:  Moderate Onset quality:  Sudden Duration:  1 month Timing:  Constant Progression:  Worsening Chronicity:  New Relieved by:  Nothing Ineffective treatments:  OTC medications Associated symptoms: sinus pain   Associated symptoms: no wheezing   Risk factors: sick contacts   Risk factors: not elderly, no chronic cardiac disease, no chronic kidney disease, no chronic respiratory disease, no diabetes mellitus, no immunosuppression and no recent illness     Past Medical History:  Diagnosis Date  . Allergic rhinitis   . Asthma    mild  . Contracture of palmar fascia   . Depression   . Headache   . Mixed hyperlipidemia   . Obesity, unspecified   . Plantar fasciitis   . Symptomatic states associated with artificial menopause   . Unspecified disorder of bladder     Patient Active Problem List   Diagnosis Date Noted  . Occipital neuralgia of right side 02/07/2017  . Elevated liver enzymes 07/07/2015  . Colon cancer screening 01/22/2013  . Routine general medical examination at a health care facility 11/11/2012  . Other screening mammogram 07/23/2012  . Postmenopausal HRT (hormone replacement therapy) 07/07/2011  . DUPUYTREN'S CONTRACTURE 03/16/2010  . MENOPAUSE, SURGICAL 09/04/2009  . ASTHMA, PERSISTENT, MILD 08/11/2008  . HYPERLIPIDEMIA 12/04/2007  . Depression 12/04/2007  . ALLERGIC RHINITIS 12/04/2007  . BLADDER PROLAPSE 12/04/2007  . Obesity 10/22/2007    Past Surgical History:  Procedure Laterality Date  . ANKLE FRACTURE  SURGERY Right   . VAGINAL HYSTERECTOMY  2008   Enterocele repair    OB History   None      Home Medications    Prior to Admission medications   Medication Sig Start Date End Date Taking? Authorizing Provider  albuterol (PROVENTIL HFA;VENTOLIN HFA) 108 (90 Base) MCG/ACT inhaler Inhale 2 puffs into the lungs every 4 (four) hours as needed for wheezing or shortness of breath (or cough). 07/21/17   [name], Audrie Gallus, MD  ALPRAZolam Prudy Feeler) 0.5 MG tablet Take 1-2 tablets 30-60 minutes before MRI. May repeat if needed.Do not drive can cause sedation. 10/02/17   Anson Fret, MD  amoxicillin (AMOXIL) 875 MG tablet Take 1 tablet (875 mg total) by mouth 2 (two) times daily. 04/11/18   Payton Mccallum, MD  estrogens, conjugated, (PREMARIN) 0.3 MG tablet Take 1 tablet (0.3 mg total) by mouth daily. 09/21/17   [name], Audrie Gallus, MD  Fluticasone-Salmeterol (ADVAIR DISKUS) 100-50 MCG/DOSE AEPB INHALE 1 PUFF INTO THE LUNGS AS NEEDED AS DIRECTED 07/21/17   [name], Audrie Gallus, MD  gabapentin (NEURONTIN) 300 MG capsule Take 2 capsules (600 mg total) by mouth 3 (three) times daily. 04/03/18   [name], Audrie Gallus, MD  metaxalone [facility]) 800 MG tablet take 1 tablet by mouth three times a day if needed for muscle spasm 11/23/17   [name], Audrie Gallus, MD  Multiple Vitamin (MULTIVITAMIN) tablet Take 1 tablet by mouth daily.      [provider]  pregabalin (LYRICA) 150 MG capsule Take 1 capsule (150  mg total) by mouth 2 (two) times daily. 10/10/17   Anson Fret, MD  sertraline (ZOLOFT) 100 MG tablet Take 1.5 tablets (150 mg total) by mouth daily. 07/21/17   [name], Audrie Gallus, MD  zolpidem (AMBIEN) 10 MG tablet Take 1 tablet (10 mg total) by mouth at bedtime as needed for sleep (during travel). 07/21/17 08/20/17  [name], Audrie Gallus, MD    Family History Family History  Problem Relation Age of Onset  . Peripheral vascular disease Father        Smoker  . Prostate cancer Father   . Heart failure Father   . Other Father         Heart "problems"  . Stroke Mother   . Headache Neg Hx   . Breast cancer Neg Hx     Social History Social History   Tobacco Use  . Smoking status: Never Smoker  . Smokeless tobacco: Never Used  Substance Use Topics  . Alcohol use: No    Alcohol/week: 0.0 oz  . Drug use: No     Allergies   Patient has no known allergies.   Review of Systems Review of Systems  HENT: Positive for congestion, rhinorrhea and sinus pain.   Respiratory: Negative for wheezing.      Physical Exam Triage Vital Signs ED Triage Vitals  Enc Vitals Group     BP 04/11/18 1419 (!) 141/85     Pulse Rate 04/11/18 1419 72     Resp 04/11/18 1419 20     Temp 04/11/18 1419 98 F (36.7 C)     Temp Source 04/11/18 1419 Oral     SpO2 04/11/18 1419 98 %     Weight 04/11/18 1417 190 lb (86.2 kg)     Height 04/11/18 1417  (1.626 m)     Head Circumference --      Peak Flow --      Pain Score 04/11/18 1416 7     Pain Loc --      Pain Edu? --      Excl. in GC? --    No data found.  Updated Vital Signs BP (!) 141/85 (BP Location: Right Arm)   Pulse 72   Temp 98 F (36.7 C) (Oral)   Resp 20   Ht  (1.626 m)   Wt 190 lb (86.2 kg)   SpO2 98%   BMI 32.61 kg/m   Visual Acuity Right Eye Distance:   Left Eye Distance:   Bilateral Distance:    Right Eye Near:   Left Eye Near:    Bilateral Near:     Physical Exam  Constitutional: She appears well-developed and well-nourished. No distress.  HENT:  Head: Normocephalic and atraumatic.  Right Ear: Tympanic membrane, external ear and ear canal normal.  Left Ear: Tympanic membrane, external ear and ear canal normal.  Nose: Mucosal edema and rhinorrhea present. No nose lacerations, sinus tenderness, nasal deformity, septal deviation or nasal septal hematoma. No epistaxis.  No foreign bodies. Right sinus exhibits maxillary sinus tenderness and frontal sinus tenderness. Left sinus exhibits maxillary sinus tenderness and frontal sinus  tenderness.  Mouth/Throat: Uvula is midline, oropharynx is clear and moist and mucous membranes are normal. No oropharyngeal exudate.  Eyes: Conjunctivae are normal. Right eye exhibits no discharge. Left eye exhibits no discharge. No scleral icterus.  Neck: Normal range of motion. Neck supple. No thyromegaly present.  Cardiovascular: Normal rate, regular rhythm and normal heart sounds.  Pulmonary/Chest: Effort normal and breath  sounds normal. No stridor. No respiratory distress. She has no wheezes. She has no rales.  Lymphadenopathy:    She has no cervical adenopathy.  Skin: She is not diaphoretic.  Nursing note and vitals reviewed.    UC Treatments / Results  Labs (all labs ordered are listed, but only abnormal results are displayed) Labs Reviewed - No data to display  EKG None  Radiology No results found.  Procedures Procedures (including critical care time)  Medications Ordered in UC Medications - No data to display  Initial Impression / Assessment and Plan / UC Course  I have reviewed the triage vital signs and the nursing notes.  Pertinent labs & imaging results that were available during my care of the patient were reviewed by me and considered in my medical decision making (see chart for details).      Final Clinical Impressions(s) / UC Diagnoses   Final diagnoses:  Acute maxillary sinusitis, recurrence not specified    ED Prescriptions    Medication Sig Dispense Auth. Provider   amoxicillin (AMOXIL) 875 MG tablet Take 1 tablet (875 mg total) by mouth 2 (two) times daily. 20 tablet Payton Mccallum, MD       1. diagnosis reviewed with patient 2. rx as per orders above; reviewed possible side effects, interactions, risks and benefits  3. Recommend supportive treatment with otc flonase 4. Follow-up prn if symptoms worsen or don't improve   Controlled Substance Prescriptions [place] Controlled Substance Registry consulted? Not Applicable   Payton Mccallum,  MD 04/11/18 [phone]

## 2018-04-24 DIAGNOSIS — M9901 Segmental and somatic dysfunction of cervical region: Secondary | ICD-10-CM | POA: Diagnosis not present

## 2018-04-24 DIAGNOSIS — M531 Cervicobrachial syndrome: Secondary | ICD-10-CM | POA: Diagnosis not present

## 2018-04-24 DIAGNOSIS — M9902 Segmental and somatic dysfunction of thoracic region: Secondary | ICD-10-CM | POA: Diagnosis not present

## 2018-04-26 ENCOUNTER — Other Ambulatory Visit: Payer: Self-pay | Admitting: Neurology

## 2018-05-08 ENCOUNTER — Other Ambulatory Visit: Payer: Self-pay | Admitting: Family Medicine

## 2018-05-08 DIAGNOSIS — M65332 Trigger finger, left middle finger: Secondary | ICD-10-CM | POA: Diagnosis not present

## 2018-05-08 DIAGNOSIS — Z1231 Encounter for screening mammogram for malignant neoplasm of breast: Secondary | ICD-10-CM

## 2018-05-08 DIAGNOSIS — M720 Palmar fascial fibromatosis [Dupuytren]: Secondary | ICD-10-CM | POA: Diagnosis not present

## 2018-05-17 ENCOUNTER — Encounter: Payer: Self-pay | Admitting: Family Medicine

## 2018-05-21 DIAGNOSIS — M9902 Segmental and somatic dysfunction of thoracic region: Secondary | ICD-10-CM | POA: Diagnosis not present

## 2018-05-21 DIAGNOSIS — M531 Cervicobrachial syndrome: Secondary | ICD-10-CM | POA: Diagnosis not present

## 2018-05-21 DIAGNOSIS — M9901 Segmental and somatic dysfunction of cervical region: Secondary | ICD-10-CM | POA: Diagnosis not present

## 2018-05-21 MED ORDER — ZOLPIDEM TARTRATE 10 MG PO TABS: 10.0000 mg | ORAL_TABLET | Freq: Every evening | ORAL | 0 refills | Status: DC | PRN

## 2018-05-21 NOTE — Telephone Encounter (Signed)
ambien for travel

## 2018-06-05 ENCOUNTER — Encounter: Payer: Self-pay | Admitting: Family Medicine

## 2018-06-11 ENCOUNTER — Ambulatory Visit
Admission: RE | Admit: 2018-06-11 | Discharge: 2018-06-11 | Disposition: A | Discharge status: Home / Self Care | Payer: BLUE CROSS/BLUE SHIELD | Source: Ambulatory Visit | Attending: Family Medicine | Admitting: Family Medicine

## 2018-06-11 DIAGNOSIS — Z1231 Encounter for screening mammogram for malignant neoplasm of breast: Secondary | ICD-10-CM | POA: Insufficient documentation

## 2018-06-11 DIAGNOSIS — M9902 Segmental and somatic dysfunction of thoracic region: Secondary | ICD-10-CM | POA: Diagnosis not present

## 2018-06-11 DIAGNOSIS — M9901 Segmental and somatic dysfunction of cervical region: Secondary | ICD-10-CM | POA: Diagnosis not present

## 2018-06-11 DIAGNOSIS — M531 Cervicobrachial syndrome: Secondary | ICD-10-CM | POA: Diagnosis not present

## 2018-07-09 DIAGNOSIS — M9903 Segmental and somatic dysfunction of lumbar region: Secondary | ICD-10-CM | POA: Diagnosis not present

## 2018-07-09 DIAGNOSIS — M5386 Other specified dorsopathies, lumbar region: Secondary | ICD-10-CM | POA: Diagnosis not present

## 2018-07-09 DIAGNOSIS — M9901 Segmental and somatic dysfunction of cervical region: Secondary | ICD-10-CM | POA: Diagnosis not present

## 2018-07-09 DIAGNOSIS — M531 Cervicobrachial syndrome: Secondary | ICD-10-CM | POA: Diagnosis not present

## 2018-07-15 ENCOUNTER — Telehealth: Payer: Self-pay | Admitting: Family Medicine

## 2018-07-15 DIAGNOSIS — Z0000 Encounter for general adult medical examination without abnormal findings: Secondary | ICD-10-CM

## 2018-07-15 DIAGNOSIS — E782 Mixed hyperlipidemia: Secondary | ICD-10-CM

## 2018-07-15 NOTE — Telephone Encounter (Signed)
-----   Message from Lauren Greeson, RT sent at 07/11/2018  4:03 PM EDT ----- Regarding: Lab orders for Friday 07/20/18 Please enter CPE lab orders for 07/20/18. Thanks-Lauren 

## 2018-07-20 ENCOUNTER — Other Ambulatory Visit (INDEPENDENT_AMBULATORY_CARE_PROVIDER_SITE_OTHER): Payer: BLUE CROSS/BLUE SHIELD

## 2018-07-20 DIAGNOSIS — E782 Mixed hyperlipidemia: Secondary | ICD-10-CM

## 2018-07-20 DIAGNOSIS — Z0000 Encounter for general adult medical examination without abnormal findings: Secondary | ICD-10-CM

## 2018-07-20 LAB — LIPID PANEL
CHOL/HDL RATIO: 4
Cholesterol: 206 mg/dL — ABNORMAL HIGH (ref 0–200)
HDL: 56.5 mg/dL (ref >39.00)
LDL CALC: 121 mg/dL — AB (ref 0–99)
NONHDL: 149.64
TRIGLYCERIDES: 144 mg/dL (ref 0.0–149.0)
VLDL: 28.8 mg/dL (ref 0.0–40.0)

## 2018-07-20 LAB — COMPREHENSIVE METABOLIC PANEL
ALT: 8 U/L (ref 0–35)
AST: 14 U/L (ref 0–37)
Albumin: 3.9 g/dL (ref 3.5–5.2)
Alkaline Phosphatase: 44 U/L (ref 39–117)
BILIRUBIN TOTAL: 0.3 mg/dL (ref 0.2–1.2)
BUN: 12 mg/dL (ref 6–23)
CALCIUM: 9.2 mg/dL (ref 8.4–10.5)
CHLORIDE: 104 meq/L (ref 96–112)
CO2: 29 mEq/L (ref 19–32)
Creatinine, Ser: 0.73 mg/dL (ref 0.40–1.20)
GFR: 86.25 mL/min (ref >60.00)
Glucose, Bld: 89 mg/dL (ref 70–99)
POTASSIUM: 4.2 meq/L (ref 3.5–5.1)
Sodium: 141 mEq/L (ref 135–145)
Total Protein: 7 g/dL (ref 6.0–8.3)

## 2018-07-20 LAB — CBC WITH DIFFERENTIAL/PLATELET
BASOS ABS: 0.1 10*3/uL (ref 0.0–0.1)
Basophils Relative: 1 % (ref 0.0–3.0)
EOS ABS: 0.2 10*3/uL (ref 0.0–0.7)
Eosinophils Relative: 4.4 % (ref 0.0–5.0)
HEMATOCRIT: 38.8 % (ref 36.0–46.0)
Hemoglobin: 13.2 g/dL (ref 12.0–15.0)
LYMPHS PCT: 28.3 % (ref 12.0–46.0)
Lymphs Abs: 1.5 10*3/uL (ref 0.7–4.0)
MCHC: 34 g/dL (ref 30.0–36.0)
MCV: 88.5 fl (ref 78.0–100.0)
Monocytes Absolute: 0.4 10*3/uL (ref 0.1–1.0)
Monocytes Relative: 6.6 % (ref 3.0–12.0)
NEUTROS ABS: 3.2 10*3/uL (ref 1.4–7.7)
Neutrophils Relative %: 59.7 % (ref 43.0–77.0)
PLATELETS: 282 10*3/uL (ref 150.0–400.0)
RBC: 4.38 Mil/uL (ref 3.87–5.11)
RDW: 13.3 % (ref 11.5–15.5)
WBC: 5.4 10*3/uL (ref 4.0–10.5)

## 2018-07-20 LAB — TSH: TSH: 2.36 u[IU]/mL (ref 0.35–4.50)

## 2018-07-25 ENCOUNTER — Encounter: Payer: Self-pay | Admitting: Family Medicine

## 2018-07-25 ENCOUNTER — Encounter: Payer: BLUE CROSS/BLUE SHIELD | Admitting: Family Medicine

## 2018-07-25 ENCOUNTER — Ambulatory Visit (INDEPENDENT_AMBULATORY_CARE_PROVIDER_SITE_OTHER): Payer: BLUE CROSS/BLUE SHIELD | Admitting: Family Medicine

## 2018-07-25 VITALS — BP 124/82 | HR 76 | Temp 98.2°F | Ht 63.0 in | Wt 183.0 lb

## 2018-07-25 DIAGNOSIS — Z0000 Encounter for general adult medical examination without abnormal findings: Secondary | ICD-10-CM

## 2018-07-25 DIAGNOSIS — M79605 Pain in left leg: Secondary | ICD-10-CM

## 2018-07-25 DIAGNOSIS — Z1211 Encounter for screening for malignant neoplasm of colon: Secondary | ICD-10-CM | POA: Diagnosis not present

## 2018-07-25 DIAGNOSIS — E6609 Other obesity due to excess calories: Secondary | ICD-10-CM

## 2018-07-25 DIAGNOSIS — E782 Mixed hyperlipidemia: Secondary | ICD-10-CM | POA: Diagnosis not present

## 2018-07-25 DIAGNOSIS — Z79890 Hormone replacement therapy (postmenopausal): Secondary | ICD-10-CM

## 2018-07-25 DIAGNOSIS — Z6832 Body mass index (BMI) 32.0-32.9, adult: Secondary | ICD-10-CM

## 2018-07-25 DIAGNOSIS — F341 Dysthymic disorder: Secondary | ICD-10-CM

## 2018-07-25 MED ORDER — ESTROGENS CONJUGATED 0.3 MG PO TABS: 0.3000 mg | ORAL_TABLET | Freq: Every day | ORAL | 11 refills | Status: DC

## 2018-07-25 MED ORDER — GABAPENTIN 300 MG PO CAPS: 600.0000 mg | ORAL_CAPSULE | Freq: Three times a day (TID) | ORAL | 11 refills | Status: DC

## 2018-07-25 MED ORDER — SERTRALINE HCL 100 MG PO TABS: 150.0000 mg | ORAL_TABLET | Freq: Every day | ORAL | 3 refills | Status: DC

## 2018-07-25 MED ORDER — FLUTICASONE-SALMETEROL 100-50 MCG/DOSE IN AEPB: INHALATION_SPRAY | RESPIRATORY_TRACT | 11 refills | Status: DC

## 2018-07-25 MED ORDER — ZOLPIDEM TARTRATE 10 MG PO TABS: 10.0000 mg | ORAL_TABLET | Freq: Every evening | ORAL | 0 refills | Status: DC | PRN

## 2018-07-25 NOTE — Patient Instructions (Addendum)
Try stopping hormones - by taking every other day for a while and then stopping  If unable to function- let us know   If you are interested in the new shingles vaccine (Shingrix) - call your local pharmacy to check on coverage and availability  If affordable - get on a wait list at your pharmacy   For cholesterol   Avoid red meat/ fried foods/ egg yolks/ fatty breakfast meats/ butter, cheese and high fat dairy/ and shellfish    Please do the ifob kit for colon cancer screening
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
## 2018-07-26 ENCOUNTER — Telehealth: Payer: Self-pay | Admitting: Family Medicine

## 2018-07-26 DIAGNOSIS — M79605 Pain in left leg: Secondary | ICD-10-CM | POA: Insufficient documentation

## 2018-07-26 NOTE — Telephone Encounter (Signed)
I ordered the xray (we had discussed this before)  Call her to arrange when she wants to do it  Thanks

## 2018-07-26 NOTE — Telephone Encounter (Signed)
Pt notified order is in and she will come tomorrow to get it done
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
## 2018-07-27 ENCOUNTER — Ambulatory Visit (INDEPENDENT_AMBULATORY_CARE_PROVIDER_SITE_OTHER)
Admission: RE | Admit: 2018-07-27 | Discharge: 2018-07-27 | Disposition: A | Discharge status: Home / Self Care | Payer: BLUE CROSS/BLUE SHIELD | Source: Ambulatory Visit | Attending: Family Medicine | Admitting: Family Medicine

## 2018-07-27 DIAGNOSIS — M79605 Pain in left leg: Secondary | ICD-10-CM

## 2018-07-27 DIAGNOSIS — M79662 Pain in left lower leg: Secondary | ICD-10-CM | POA: Diagnosis not present

## 2018-07-28 NOTE — Assessment & Plan Note (Signed)
Doing well with zoloft ambien prn for pain  Reviewed stressors/ coping techniques/symptoms/ support sources/ tx options and side effects in detail today

## 2018-07-28 NOTE — Assessment & Plan Note (Signed)
Discussed how this problem influences overall health and the risks it imposes  Reviewed plan for weight loss with lower calorie diet (via better food choices and also portion control or program like weight watchers) and exercise building up to or more than 30 minutes 5 days per week including some aerobic activity    

## 2018-07-28 NOTE — Assessment & Plan Note (Signed)
ifob kit given 

## 2018-07-28 NOTE — Assessment & Plan Note (Signed)
Disc goals for lipids and reasons to control them Rev last labs with pt Rev low sat fat diet in detail  

## 2018-07-28 NOTE — Assessment & Plan Note (Signed)
Pt is ready to wean her estrogen Recommend taking every other day to start Then stop when ready  Do expect hot flashes Disc risks of HRT with age including clots and breast cancer

## 2018-07-28 NOTE — Assessment & Plan Note (Signed)
Lower leg contusion after trauma Reassuring exam  Recommend ice/cold comp as needed

## 2018-07-28 NOTE — Assessment & Plan Note (Signed)
Reviewed health habits including diet and exercise and skin cancer prevention Reviewed appropriate screening tests for age  Also reviewed health mt list, fam hx and immunization status , as well as social and family history   See HPI Labs reviewed  shingrix vaccine - recommended she get on wait list  Also get a flu shot in the fall  Declines colonoscopy- ifob kit given (also info on cologuard to see if ins covers)

## 2018-07-30 DIAGNOSIS — M9902 Segmental and somatic dysfunction of thoracic region: Secondary | ICD-10-CM | POA: Diagnosis not present

## 2018-07-30 DIAGNOSIS — M9901 Segmental and somatic dysfunction of cervical region: Secondary | ICD-10-CM | POA: Diagnosis not present

## 2018-07-30 DIAGNOSIS — M531 Cervicobrachial syndrome: Secondary | ICD-10-CM | POA: Diagnosis not present

## 2018-08-10 ENCOUNTER — Encounter: Payer: Self-pay | Admitting: Family Medicine

## 2018-08-14 ENCOUNTER — Encounter: Payer: Self-pay | Admitting: Family Medicine

## 2018-08-20 ENCOUNTER — Encounter: Payer: Self-pay | Admitting: Family Medicine

## 2018-08-20 ENCOUNTER — Ambulatory Visit: Payer: BLUE CROSS/BLUE SHIELD | Admitting: Family Medicine

## 2018-08-20 ENCOUNTER — Ambulatory Visit (INDEPENDENT_AMBULATORY_CARE_PROVIDER_SITE_OTHER)
Admission: RE | Admit: 2018-08-20 | Discharge: 2018-08-20 | Disposition: A | Discharge status: Home / Self Care | Payer: BLUE CROSS/BLUE SHIELD | Source: Ambulatory Visit | Attending: Family Medicine | Admitting: Family Medicine

## 2018-08-20 VITALS — BP 128/90 | HR 61 | Temp 98.1°F | Ht 63.0 in | Wt 184.8 lb

## 2018-08-20 DIAGNOSIS — M25552 Pain in left hip: Secondary | ICD-10-CM | POA: Diagnosis not present

## 2018-08-20 DIAGNOSIS — R1032 Left lower quadrant pain: Secondary | ICD-10-CM | POA: Diagnosis not present

## 2018-08-20 DIAGNOSIS — M79605 Pain in left leg: Secondary | ICD-10-CM | POA: Diagnosis not present

## 2018-08-20 DIAGNOSIS — Z23 Encounter for immunization: Secondary | ICD-10-CM

## 2018-08-20 MED ORDER — ZOLPIDEM TARTRATE 10 MG PO TABS: 10.0000 mg | ORAL_TABLET | Freq: Every evening | ORAL | 0 refills | Status: DC | PRN
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
## 2018-08-20 NOTE — Patient Instructions (Addendum)
Use heat or ice as needed for pain in the groin and leg  We will xray your hip today   Continue the muscle relaxer if helpful   Try aleve for pain instead of aspirin - take 1-2 pills every 12 hours with a meal

## 2018-08-26 ENCOUNTER — Other Ambulatory Visit: Payer: Self-pay | Admitting: Family Medicine

## 2018-08-27 DIAGNOSIS — M531 Cervicobrachial syndrome: Secondary | ICD-10-CM | POA: Diagnosis not present

## 2018-08-27 DIAGNOSIS — M9902 Segmental and somatic dysfunction of thoracic region: Secondary | ICD-10-CM | POA: Diagnosis not present

## 2018-08-27 DIAGNOSIS — M9901 Segmental and somatic dysfunction of cervical region: Secondary | ICD-10-CM | POA: Diagnosis not present

## 2018-09-11 DIAGNOSIS — M9901 Segmental and somatic dysfunction of cervical region: Secondary | ICD-10-CM | POA: Diagnosis not present

## 2018-09-11 DIAGNOSIS — M9902 Segmental and somatic dysfunction of thoracic region: Secondary | ICD-10-CM | POA: Diagnosis not present

## 2018-09-11 DIAGNOSIS — M531 Cervicobrachial syndrome: Secondary | ICD-10-CM | POA: Diagnosis not present

## 2018-10-02 DIAGNOSIS — M9902 Segmental and somatic dysfunction of thoracic region: Secondary | ICD-10-CM | POA: Diagnosis not present

## 2018-10-02 DIAGNOSIS — M9901 Segmental and somatic dysfunction of cervical region: Secondary | ICD-10-CM | POA: Diagnosis not present

## 2018-10-02 DIAGNOSIS — M531 Cervicobrachial syndrome: Secondary | ICD-10-CM | POA: Diagnosis not present

## 2018-10-18 ENCOUNTER — Ambulatory Visit: Payer: BLUE CROSS/BLUE SHIELD | Admitting: Family Medicine

## 2018-10-18 ENCOUNTER — Ambulatory Visit (INDEPENDENT_AMBULATORY_CARE_PROVIDER_SITE_OTHER): Payer: BLUE CROSS/BLUE SHIELD

## 2018-10-18 VITALS — BP 124/80 | HR 59 | Temp 97.9°F | Ht 63.0 in | Wt 184.6 lb

## 2018-10-18 DIAGNOSIS — M79605 Pain in left leg: Secondary | ICD-10-CM

## 2018-10-18 MED ORDER — PREDNISONE 5 MG PO TABS: ORAL_TABLET | ORAL | 0 refills | Status: DC

## 2018-10-18 NOTE — Assessment & Plan Note (Signed)
Unclear to the source of her pain.  Possible for sciatic type symptoms.  But does have pain that is reproducible with palpation.  Possible for blood clot.  Could be associated with compartment syndrome however unlikely. -Venous duplex. -Prednisone. -If no improvement may need to consider MRI versus compartment testing.

## 2018-10-18 NOTE — Progress Notes (Signed)
Ariel Compton - 62 y.o. female MRN [id number]  Date of birth: 1956/03/25  SUBJECTIVE:  Including CC & ROS.  Chief Complaint  Patient presents with  . Leg Pain    pt. reports having intermittent left shin pain x 3 months; affecting gait at this time; walking with a limp; no redness or bruising present    Ariel Compton is a 62 y.o. female that is presenting with left lateral leg pain of the lower limb.  Reports this pain is been ongoing for about 3 months now.  The pain is occurring in the lateral aspect of the lower limb with no radiation proximally or distally.  Has not tried any medications or therapy for this.  The pain seems to be staying the same.  She denies any inciting event.  She did notice it after traveling back from [place].  Denies any history of blood clots.  The pain is intermittent and denies any provocative maneuvers.  Independent review of the tib-fib x-ray from 8/16 shows a normal exam.  Dependent review of the left hip x-ray from 9/9 shows no significant degenerative changes   Review of Systems  Constitutional: Negative for fever.  HENT: Negative for congestion.   Respiratory: Negative for cough.   Cardiovascular: Negative for chest pain.  Gastrointestinal: Negative for abdominal pain.  Musculoskeletal: Negative for back pain.  Skin: Negative for color change.  Neurological: Negative for weakness.  Hematological: Negative for adenopathy.  Psychiatric/Behavioral: Negative for agitation.    HISTORY: Past Medical, Surgical, Social, and Family History Reviewed & Updated per EMR.   Pertinent Historical Findings include:  Past Medical History:  Diagnosis Date  . Allergic rhinitis   . Asthma    mild  . Contracture of palmar fascia   . Depression   . Headache   . Mixed hyperlipidemia   . Obesity, unspecified   . Plantar fasciitis   . Symptomatic states associated with artificial menopause   . Unspecified disorder of bladder     Past Surgical History:    Procedure Laterality Date  . ANKLE FRACTURE SURGERY Right   . VAGINAL HYSTERECTOMY  2008   Enterocele repair    No Known Allergies  Family History  Problem Relation Age of Onset  . Peripheral vascular disease Father        Smoker  . Prostate cancer Father   . Heart failure Father   . Other Father        Heart "problems"  . Stroke Mother   . Headache Neg Hx   . Breast cancer Neg Hx      Social History   Socioeconomic History  . Marital status: Married    Spouse name: Not on file  . Number of children: 2  . Years of education: 24  . Highest education level: Not on file  Occupational History  . Occupation: Arts administrator  Social Needs  . Financial resource strain: Not on file  . Food insecurity:    Worry: Not on file    Inability: Not on file  . Transportation needs:    Medical: Not on file    Non-medical: Not on file  Tobacco Use  . Smoking status: Never Smoker  . Smokeless tobacco: Never Used  Substance and Sexual Activity  . Alcohol use: No    Alcohol/week: 0.0 standard drinks  . Drug use: No  . Sexual activity: Not on file  Lifestyle  . Physical activity:    Days per week: Not on file  Minutes per session: Not on file  . Stress: Not on file  Relationships  . Social connections:    Talks on phone: Not on file    Gets together: Not on file    Attends religious service: Not on file    Active member of club or organization: Not on file    Attends meetings of clubs or organizations: Not on file    Relationship status: Not on file  . Intimate partner violence:    Fear of current or ex partner: Not on file    Emotionally abused: Not on file    Physically abused: Not on file    Forced sexual activity: Not on file  Other Topics Concern  . Not on file  Social History Narrative   Lives at home w/ her husband   Right-handed   Caffeine: rare     PHYSICAL EXAM:  VS: BP 124/80 (BP Location: Left Arm, Cuff Size: Normal)   Pulse (!) 59   Temp  97.9 F (36.6 C) (Oral)   Ht 5\' 3"  (1.6 m)   Wt 184 lb 9.6 oz (83.7 kg)   SpO2 97%   BMI 32.70 kg/m  Physical Exam Gen: NAD, alert, cooperative with exam, well-appearing ENT: normal lips, normal nasal mucosa,  Eye: normal EOM, normal conjunctiva and lids CV:  no edema, +2 pedal pulses   Resp: no accessory muscle use, non-labored,  Skin: no rashes, no areas of induration  Neuro: normal tone, normal sensation to touch Psych:  normal insight, alert and oriented MSK:  Left leg: No signs of atrophy. Mildly tender over the anterior lateral aspect of the left lower leg. Normal range of motion of the ankle. Normal strength resistance with dorsiflexion and plantarflexion. Normal knee flexion extension. Circumference of the calf is similar to the right. Neurovascular intact  Limited ultrasound: Left lower leg:  Normal-appearing tibia and fibula The anterior and lateral compartment of the lower legs appears to be normal  Summary: Normal exam  Ultrasound and interpretation by Clare Gandy, MD      ASSESSMENT & PLAN:   Left leg pain Unclear to the source of her pain.  Possible for sciatic type symptoms.  But does have pain that is reproducible with palpation.  Possible for blood clot.  Could be associated with compartment syndrome however unlikely. -Venous duplex. -Prednisone. -If no improvement may need to consider MRI versus compartment testing.

## 2018-10-18 NOTE — Patient Instructions (Signed)
Nice to meet you  Please try the medicine  I will call you with the results from the study  Please follow up with me in 2-3 weeks if there is no improvement.

## 2018-10-19 ENCOUNTER — Ambulatory Visit (HOSPITAL_COMMUNITY)
Admission: RE | Admit: 2018-10-19 | Discharge: 2018-10-19 | Disposition: A | Discharge status: Home / Self Care | Payer: BLUE CROSS/BLUE SHIELD | Source: Ambulatory Visit | Attending: Cardiology | Admitting: Cardiology

## 2018-10-19 DIAGNOSIS — M79605 Pain in left leg: Secondary | ICD-10-CM | POA: Diagnosis not present

## 2018-10-25 ENCOUNTER — Telehealth: Payer: Self-pay | Admitting: Family Medicine

## 2018-10-25 ENCOUNTER — Encounter: Payer: Self-pay | Admitting: Family Medicine

## 2018-10-25 NOTE — Telephone Encounter (Signed)
Left VM for patient. If she calls back please have her speak with a nurse/CMA and inform that we may need to schedule an anti-inflammatory or try gabapentin. The [facility] can report results to patient.   If any questions then please take the best time and phone number to call and I will try to call her back.   Myra RudeSchmitz, Jeremy E, MD [facility] 10/25/2018, 4:41 PM

## 2018-10-30 DIAGNOSIS — M9902 Segmental and somatic dysfunction of thoracic region: Secondary | ICD-10-CM | POA: Diagnosis not present

## 2018-10-30 DIAGNOSIS — M530 Cervicocranial syndrome: Secondary | ICD-10-CM | POA: Diagnosis not present

## 2018-10-30 DIAGNOSIS — M531 Cervicobrachial syndrome: Secondary | ICD-10-CM | POA: Diagnosis not present

## 2018-11-24 DIAGNOSIS — H524 Presbyopia: Secondary | ICD-10-CM | POA: Diagnosis not present

## 2018-11-27 DIAGNOSIS — M5386 Other specified dorsopathies, lumbar region: Secondary | ICD-10-CM | POA: Diagnosis not present

## 2018-11-27 DIAGNOSIS — M7612 Psoas tendinitis, left hip: Secondary | ICD-10-CM | POA: Diagnosis not present

## 2018-11-27 DIAGNOSIS — M531 Cervicobrachial syndrome: Secondary | ICD-10-CM | POA: Diagnosis not present

## 2018-11-27 DIAGNOSIS — M9903 Segmental and somatic dysfunction of lumbar region: Secondary | ICD-10-CM | POA: Diagnosis not present

## 2018-12-15 IMAGING — MG MM DIGITAL SCREENING BILAT W/ TOMO W/ CAD
8 series · 8 of 24 positions shown · non-contrast
Comparison: Previous exam(s).

ACR Breast Density Category a: The breast tissue is almost entirely
fatty.

CLINICAL DATA: Screening.

EXAM:
DIGITAL SCREENING BILATERAL MAMMOGRAM WITH TOMO AND CAD

[L MLO synth-2D]
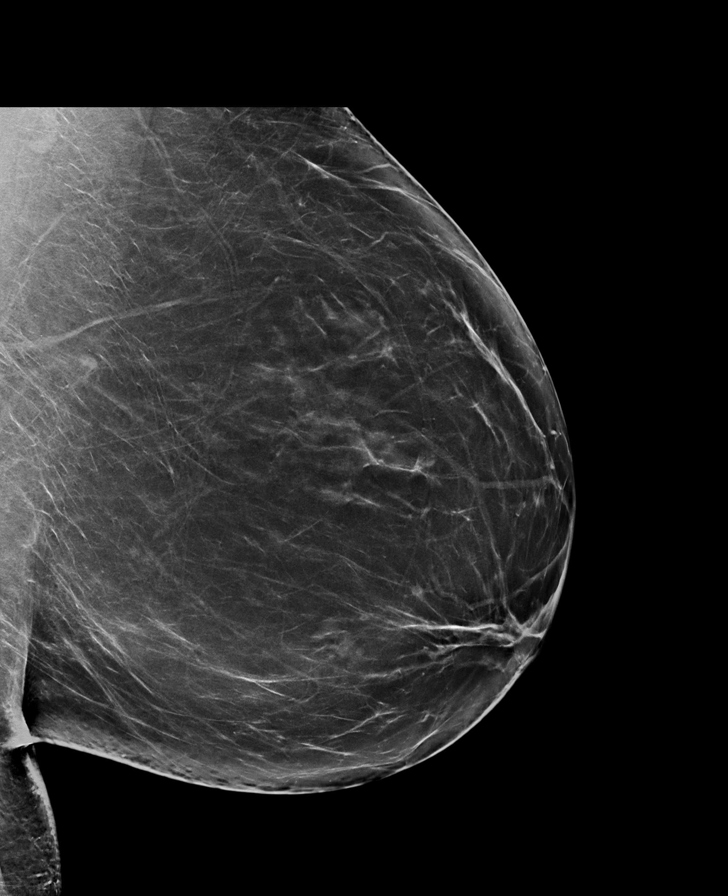

[R CC synth-2D]
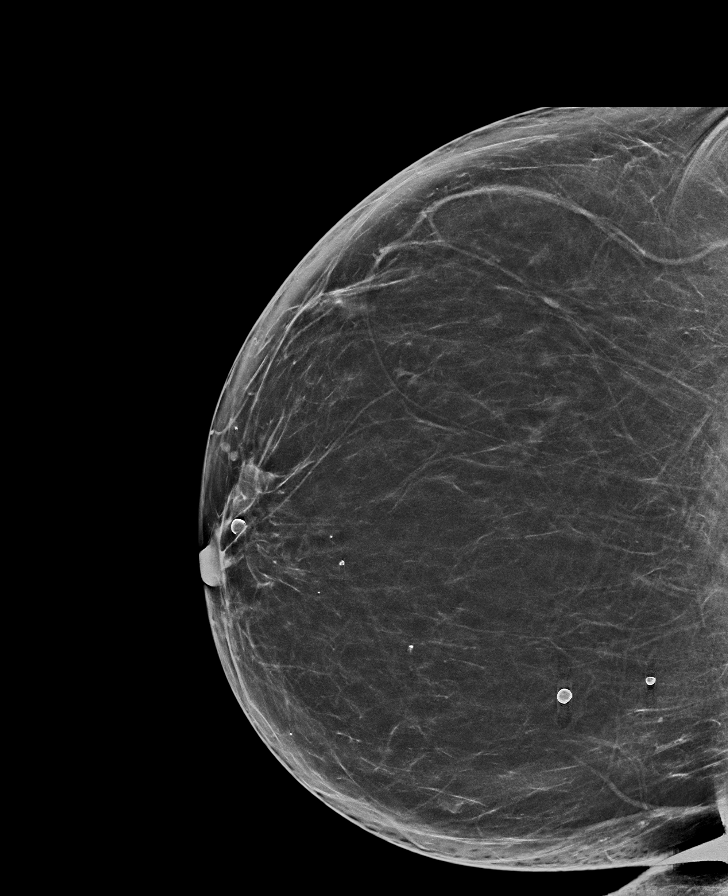

[R MLO synth-2D]
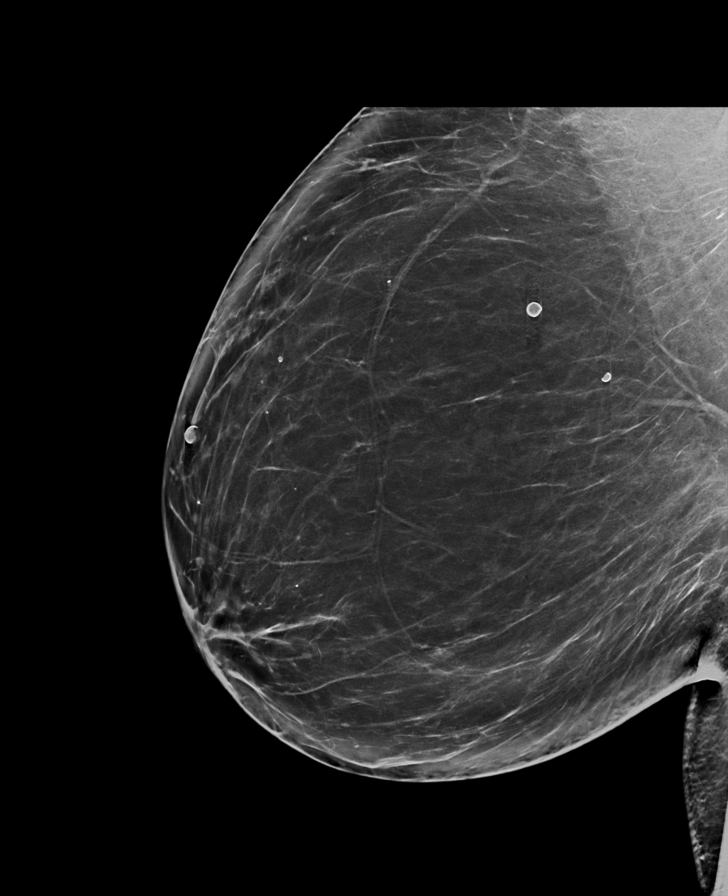

[L CC synth-2D]
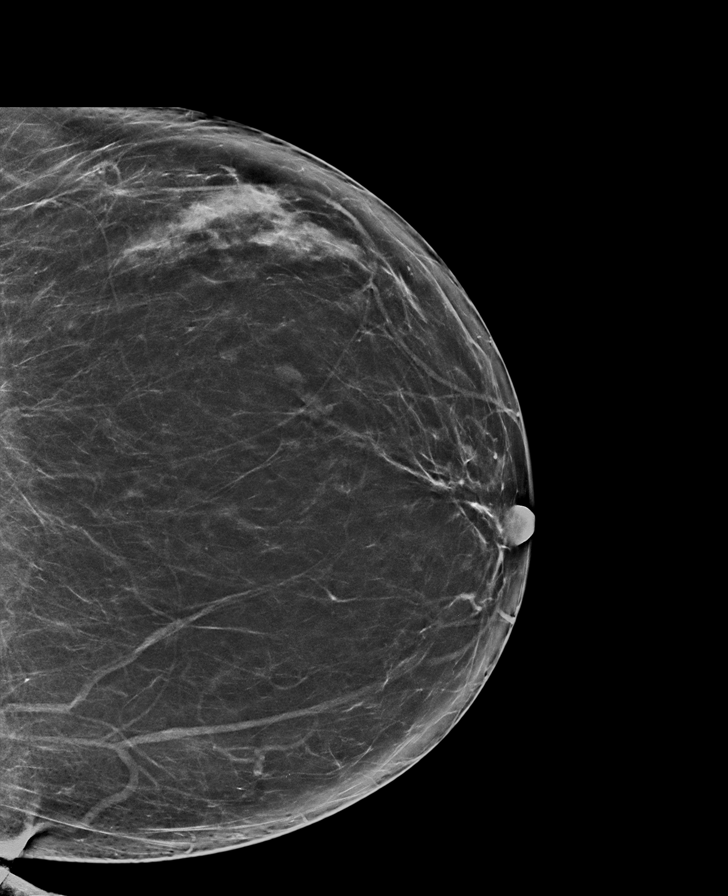

[R CC tomo · tomo slice 39/76.0]
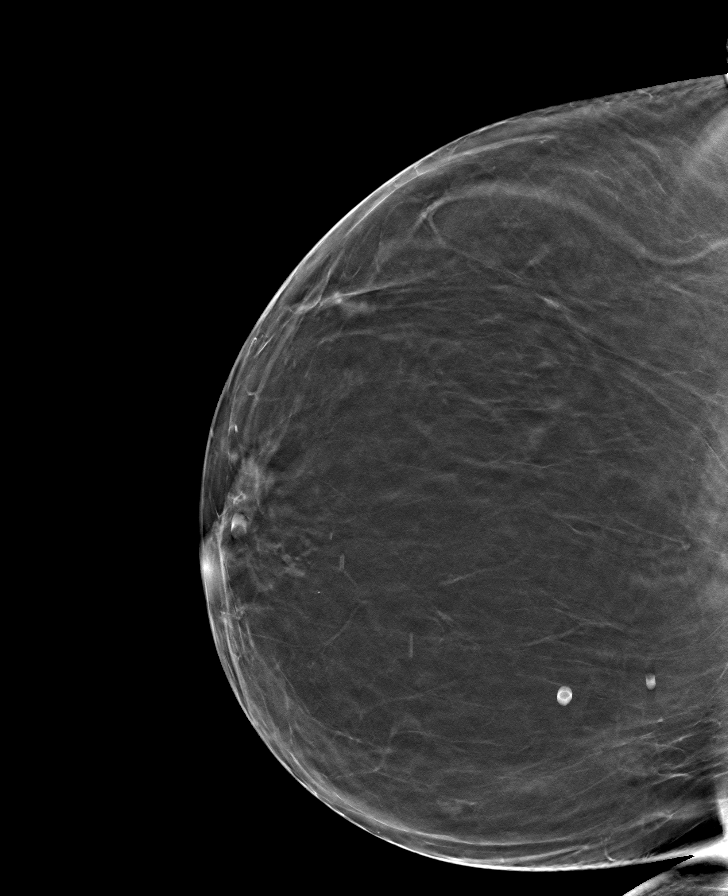

[R MLO tomo · tomo slice 45/89.0]
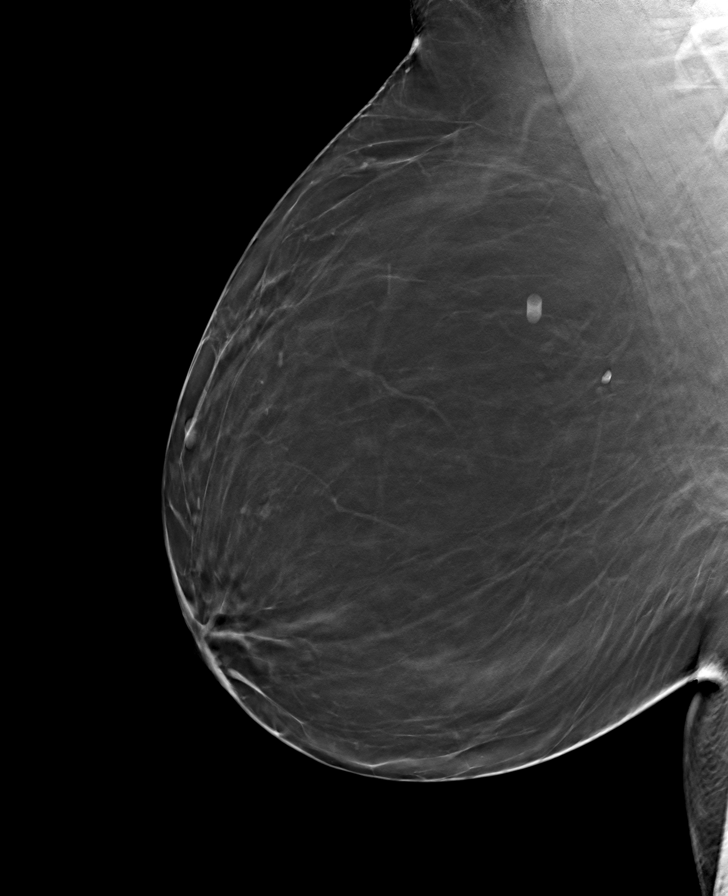

[L MLO tomo · tomo slice 45/90.0]
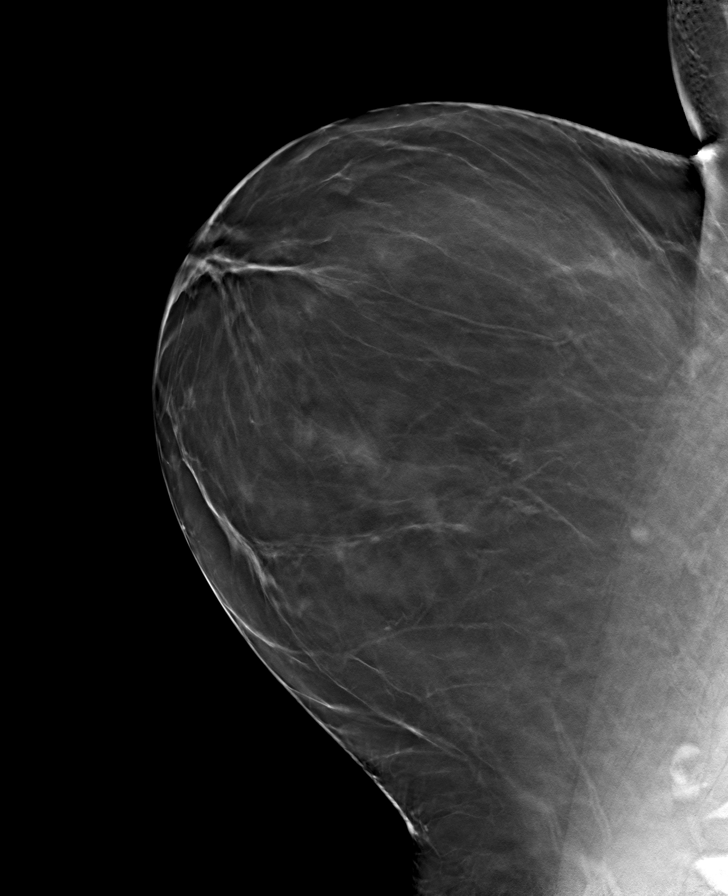

[L CC tomo · tomo slice 39/76.0]
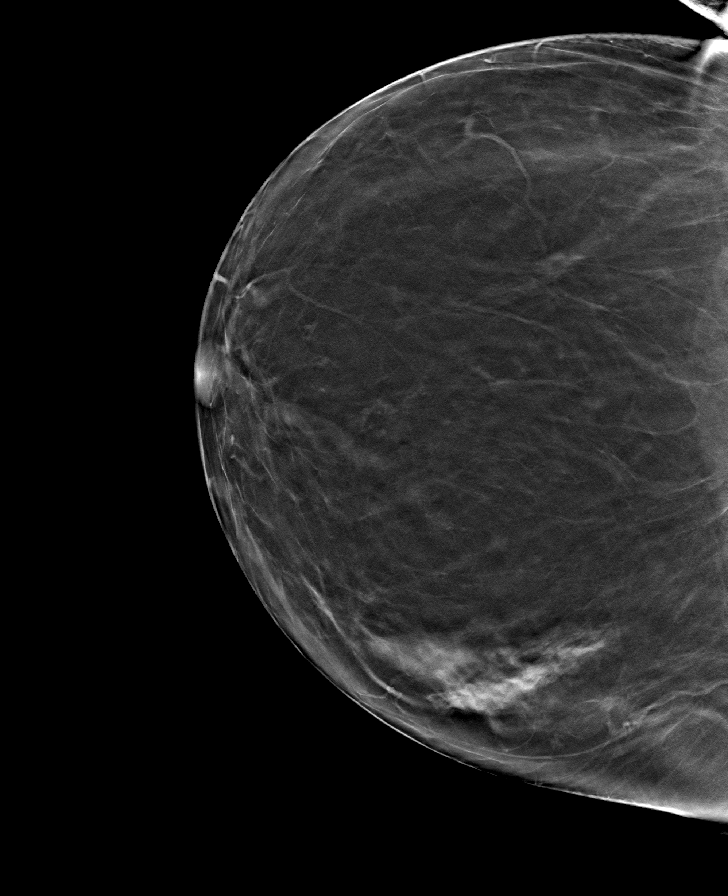

[8 of 24 positions shown; findings below may reference images not displayed]

FINDINGS: There are no findings suspicious for malignancy. Images were
processed with CAD.
IMPRESSION: No mammographic evidence of malignancy. A result letter of this
screening mammogram will be mailed directly to the patient.

RECOMMENDATION:
Screening mammogram in one year. (Code:[id number])

BI-RADS CATEGORY  1: Negative.

## 2019-01-07 DIAGNOSIS — M9901 Segmental and somatic dysfunction of cervical region: Secondary | ICD-10-CM | POA: Diagnosis not present

## 2019-01-07 DIAGNOSIS — M4692 Unspecified inflammatory spondylopathy, cervical region: Secondary | ICD-10-CM | POA: Diagnosis not present

## 2019-01-07 DIAGNOSIS — M50322 Other cervical disc degeneration at C5-C6 level: Secondary | ICD-10-CM | POA: Diagnosis not present

## 2019-01-07 DIAGNOSIS — M5481 Occipital neuralgia: Secondary | ICD-10-CM | POA: Diagnosis not present

## 2019-01-10 DIAGNOSIS — M5481 Occipital neuralgia: Secondary | ICD-10-CM | POA: Diagnosis not present

## 2019-01-10 DIAGNOSIS — M9901 Segmental and somatic dysfunction of cervical region: Secondary | ICD-10-CM | POA: Diagnosis not present

## 2019-01-10 DIAGNOSIS — M50322 Other cervical disc degeneration at C5-C6 level: Secondary | ICD-10-CM | POA: Diagnosis not present

## 2019-01-10 DIAGNOSIS — M4692 Unspecified inflammatory spondylopathy, cervical region: Secondary | ICD-10-CM | POA: Diagnosis not present

## 2019-01-14 DIAGNOSIS — M5481 Occipital neuralgia: Secondary | ICD-10-CM | POA: Diagnosis not present

## 2019-01-14 DIAGNOSIS — M50322 Other cervical disc degeneration at C5-C6 level: Secondary | ICD-10-CM | POA: Diagnosis not present

## 2019-01-14 DIAGNOSIS — M4692 Unspecified inflammatory spondylopathy, cervical region: Secondary | ICD-10-CM | POA: Diagnosis not present

## 2019-01-14 DIAGNOSIS — M9901 Segmental and somatic dysfunction of cervical region: Secondary | ICD-10-CM | POA: Diagnosis not present

## 2019-01-16 DIAGNOSIS — M5481 Occipital neuralgia: Secondary | ICD-10-CM | POA: Diagnosis not present

## 2019-01-16 DIAGNOSIS — M9901 Segmental and somatic dysfunction of cervical region: Secondary | ICD-10-CM | POA: Diagnosis not present

## 2019-01-16 DIAGNOSIS — M50322 Other cervical disc degeneration at C5-C6 level: Secondary | ICD-10-CM | POA: Diagnosis not present

## 2019-01-16 DIAGNOSIS — M4692 Unspecified inflammatory spondylopathy, cervical region: Secondary | ICD-10-CM | POA: Diagnosis not present

## 2019-01-16 DIAGNOSIS — M720 Palmar fascial fibromatosis [Dupuytren]: Secondary | ICD-10-CM | POA: Diagnosis not present

## 2019-01-21 DIAGNOSIS — M9901 Segmental and somatic dysfunction of cervical region: Secondary | ICD-10-CM | POA: Diagnosis not present

## 2019-01-21 DIAGNOSIS — M50322 Other cervical disc degeneration at C5-C6 level: Secondary | ICD-10-CM | POA: Diagnosis not present

## 2019-01-21 DIAGNOSIS — M5481 Occipital neuralgia: Secondary | ICD-10-CM | POA: Diagnosis not present

## 2019-01-21 DIAGNOSIS — M4692 Unspecified inflammatory spondylopathy, cervical region: Secondary | ICD-10-CM | POA: Diagnosis not present

## 2019-01-24 DIAGNOSIS — M50322 Other cervical disc degeneration at C5-C6 level: Secondary | ICD-10-CM | POA: Diagnosis not present

## 2019-01-24 DIAGNOSIS — M5481 Occipital neuralgia: Secondary | ICD-10-CM | POA: Diagnosis not present

## 2019-01-24 DIAGNOSIS — M9901 Segmental and somatic dysfunction of cervical region: Secondary | ICD-10-CM | POA: Diagnosis not present

## 2019-01-24 DIAGNOSIS — M4692 Unspecified inflammatory spondylopathy, cervical region: Secondary | ICD-10-CM | POA: Diagnosis not present

## 2019-02-04 DIAGNOSIS — M50322 Other cervical disc degeneration at C5-C6 level: Secondary | ICD-10-CM | POA: Diagnosis not present

## 2019-02-04 DIAGNOSIS — M4692 Unspecified inflammatory spondylopathy, cervical region: Secondary | ICD-10-CM | POA: Diagnosis not present

## 2019-02-04 DIAGNOSIS — M9901 Segmental and somatic dysfunction of cervical region: Secondary | ICD-10-CM | POA: Diagnosis not present

## 2019-02-04 DIAGNOSIS — M5481 Occipital neuralgia: Secondary | ICD-10-CM | POA: Diagnosis not present

## 2019-02-06 DIAGNOSIS — M720 Palmar fascial fibromatosis [Dupuytren]: Secondary | ICD-10-CM | POA: Diagnosis not present

## 2019-02-07 ENCOUNTER — Encounter: Payer: Self-pay | Admitting: Family Medicine

## 2019-02-07 DIAGNOSIS — M9901 Segmental and somatic dysfunction of cervical region: Secondary | ICD-10-CM | POA: Diagnosis not present

## 2019-02-07 DIAGNOSIS — M5481 Occipital neuralgia: Secondary | ICD-10-CM | POA: Diagnosis not present

## 2019-02-07 DIAGNOSIS — M50322 Other cervical disc degeneration at C5-C6 level: Secondary | ICD-10-CM | POA: Diagnosis not present

## 2019-02-07 DIAGNOSIS — M4692 Unspecified inflammatory spondylopathy, cervical region: Secondary | ICD-10-CM | POA: Diagnosis not present

## 2019-02-07 MED ORDER — METAXALONE 800 MG PO TABS: ORAL_TABLET | ORAL | 1 refills | Status: DC

## 2019-02-07 NOTE — Telephone Encounter (Signed)
Refill

## 2019-02-11 DIAGNOSIS — M4692 Unspecified inflammatory spondylopathy, cervical region: Secondary | ICD-10-CM | POA: Diagnosis not present

## 2019-02-11 DIAGNOSIS — M9901 Segmental and somatic dysfunction of cervical region: Secondary | ICD-10-CM | POA: Diagnosis not present

## 2019-02-11 DIAGNOSIS — M50322 Other cervical disc degeneration at C5-C6 level: Secondary | ICD-10-CM | POA: Diagnosis not present

## 2019-02-11 DIAGNOSIS — M5481 Occipital neuralgia: Secondary | ICD-10-CM | POA: Diagnosis not present

## 2019-02-13 DIAGNOSIS — M25641 Stiffness of right hand, not elsewhere classified: Secondary | ICD-10-CM | POA: Diagnosis not present

## 2019-02-13 DIAGNOSIS — M720 Palmar fascial fibromatosis [Dupuytren]: Secondary | ICD-10-CM | POA: Diagnosis not present

## 2019-02-18 DIAGNOSIS — M50322 Other cervical disc degeneration at C5-C6 level: Secondary | ICD-10-CM | POA: Diagnosis not present

## 2019-02-18 DIAGNOSIS — M4692 Unspecified inflammatory spondylopathy, cervical region: Secondary | ICD-10-CM | POA: Diagnosis not present

## 2019-02-18 DIAGNOSIS — M9901 Segmental and somatic dysfunction of cervical region: Secondary | ICD-10-CM | POA: Diagnosis not present

## 2019-02-18 DIAGNOSIS — M5481 Occipital neuralgia: Secondary | ICD-10-CM | POA: Diagnosis not present

## 2019-03-16 ENCOUNTER — Other Ambulatory Visit: Payer: Self-pay | Admitting: Family Medicine

## 2019-03-21 DIAGNOSIS — M5481 Occipital neuralgia: Secondary | ICD-10-CM | POA: Diagnosis not present

## 2019-03-21 DIAGNOSIS — M4692 Unspecified inflammatory spondylopathy, cervical region: Secondary | ICD-10-CM | POA: Diagnosis not present

## 2019-03-21 DIAGNOSIS — M9901 Segmental and somatic dysfunction of cervical region: Secondary | ICD-10-CM | POA: Diagnosis not present

## 2019-03-21 DIAGNOSIS — M50322 Other cervical disc degeneration at C5-C6 level: Secondary | ICD-10-CM | POA: Diagnosis not present

## 2019-03-22 DIAGNOSIS — M50322 Other cervical disc degeneration at C5-C6 level: Secondary | ICD-10-CM | POA: Diagnosis not present

## 2019-03-22 DIAGNOSIS — M9901 Segmental and somatic dysfunction of cervical region: Secondary | ICD-10-CM | POA: Diagnosis not present

## 2019-03-22 DIAGNOSIS — M4692 Unspecified inflammatory spondylopathy, cervical region: Secondary | ICD-10-CM | POA: Diagnosis not present

## 2019-03-22 DIAGNOSIS — M5481 Occipital neuralgia: Secondary | ICD-10-CM | POA: Diagnosis not present

## 2019-03-27 DIAGNOSIS — M5481 Occipital neuralgia: Secondary | ICD-10-CM | POA: Diagnosis not present

## 2019-03-27 DIAGNOSIS — M9901 Segmental and somatic dysfunction of cervical region: Secondary | ICD-10-CM | POA: Diagnosis not present

## 2019-03-27 DIAGNOSIS — M4692 Unspecified inflammatory spondylopathy, cervical region: Secondary | ICD-10-CM | POA: Diagnosis not present

## 2019-03-27 DIAGNOSIS — M50322 Other cervical disc degeneration at C5-C6 level: Secondary | ICD-10-CM | POA: Diagnosis not present

## 2019-03-28 ENCOUNTER — Other Ambulatory Visit: Payer: Self-pay | Admitting: Family Medicine

## 2019-03-28 MED ORDER — ZOLPIDEM TARTRATE 10 MG PO TABS: 10.0000 mg | ORAL_TABLET | Freq: Every evening | ORAL | 0 refills | Status: DC | PRN

## 2019-03-28 NOTE — Telephone Encounter (Signed)
Pt's comment: "I am having been having terrible trouble sleeping over the last 2 months. I have been using melatonin and Benadryl and exercising but am still having Coronavirus related sleep disruption. Would it be possible to have a few ambien? Even just 3 would help. Thank you so much . I hope you are all ok. Calisa"  Last filled on 08/20/18 #15 tabs with 0 refills CPE scheduled 07/30/19

## 2019-03-29 ENCOUNTER — Encounter: Payer: Self-pay | Admitting: Family Medicine

## 2019-04-03 DIAGNOSIS — M5481 Occipital neuralgia: Secondary | ICD-10-CM | POA: Diagnosis not present

## 2019-04-03 DIAGNOSIS — M50322 Other cervical disc degeneration at C5-C6 level: Secondary | ICD-10-CM | POA: Diagnosis not present

## 2019-04-03 DIAGNOSIS — M4692 Unspecified inflammatory spondylopathy, cervical region: Secondary | ICD-10-CM | POA: Diagnosis not present

## 2019-04-03 DIAGNOSIS — M9901 Segmental and somatic dysfunction of cervical region: Secondary | ICD-10-CM | POA: Diagnosis not present

## 2019-04-10 DIAGNOSIS — M9901 Segmental and somatic dysfunction of cervical region: Secondary | ICD-10-CM | POA: Diagnosis not present

## 2019-04-10 DIAGNOSIS — M5481 Occipital neuralgia: Secondary | ICD-10-CM | POA: Diagnosis not present

## 2019-04-10 DIAGNOSIS — M4692 Unspecified inflammatory spondylopathy, cervical region: Secondary | ICD-10-CM | POA: Diagnosis not present

## 2019-04-10 DIAGNOSIS — M50322 Other cervical disc degeneration at C5-C6 level: Secondary | ICD-10-CM | POA: Diagnosis not present

## 2019-04-23 ENCOUNTER — Encounter: Payer: Self-pay | Admitting: Family Medicine

## 2019-06-11 ENCOUNTER — Other Ambulatory Visit: Payer: Self-pay | Admitting: Family Medicine

## 2019-06-11 DIAGNOSIS — Z1231 Encounter for screening mammogram for malignant neoplasm of breast: Secondary | ICD-10-CM

## 2019-07-23 ENCOUNTER — Ambulatory Visit
Admission: RE | Admit: 2019-07-23 | Discharge: 2019-07-23 | Disposition: A | Discharge status: Home / Self Care | Payer: BC Managed Care – PPO | Source: Ambulatory Visit | Attending: Family Medicine | Admitting: Family Medicine

## 2019-07-23 ENCOUNTER — Other Ambulatory Visit: Payer: Self-pay

## 2019-07-23 DIAGNOSIS — Z1231 Encounter for screening mammogram for malignant neoplasm of breast: Secondary | ICD-10-CM | POA: Diagnosis not present

## 2019-07-24 ENCOUNTER — Telehealth: Payer: Self-pay

## 2019-07-24 NOTE — Telephone Encounter (Signed)
Left detailed VM w COVID screen and back door lab info   

## 2019-07-25 ENCOUNTER — Telehealth: Payer: Self-pay | Admitting: Family Medicine

## 2019-07-25 DIAGNOSIS — Z0000 Encounter for general adult medical examination without abnormal findings: Secondary | ICD-10-CM

## 2019-07-25 DIAGNOSIS — E782 Mixed hyperlipidemia: Secondary | ICD-10-CM

## 2019-07-25 NOTE — Telephone Encounter (Signed)
-----   Message from Ellamae Sia sent at 07/15/2019  4:02 PM EDT ----- Regarding: Lab orders for Friday, 8.14.20 Patient is scheduled for CPX labs, please order future labs, Thanks , Karna Christmas

## 2019-07-26 ENCOUNTER — Other Ambulatory Visit: Payer: Self-pay

## 2019-07-26 ENCOUNTER — Other Ambulatory Visit (INDEPENDENT_AMBULATORY_CARE_PROVIDER_SITE_OTHER): Payer: BC Managed Care – PPO

## 2019-07-26 DIAGNOSIS — Z0000 Encounter for general adult medical examination without abnormal findings: Secondary | ICD-10-CM

## 2019-07-26 DIAGNOSIS — E782 Mixed hyperlipidemia: Secondary | ICD-10-CM | POA: Diagnosis not present

## 2019-07-26 LAB — CBC WITH DIFFERENTIAL/PLATELET
Basophils Absolute: 0.1 10*3/uL (ref 0.0–0.1)
Basophils Relative: 1 % (ref 0.0–3.0)
Eosinophils Absolute: 0.2 10*3/uL (ref 0.0–0.7)
Eosinophils Relative: 3.3 % (ref 0.0–5.0)
HCT: 38.6 % (ref 36.0–46.0)
Hemoglobin: 13 g/dL (ref 12.0–15.0)
Lymphocytes Relative: 30.2 % (ref 12.0–46.0)
Lymphs Abs: 1.7 10*3/uL (ref 0.7–4.0)
MCHC: 33.6 g/dL (ref 30.0–36.0)
MCV: 89.1 fl (ref 78.0–100.0)
Monocytes Absolute: 0.4 10*3/uL (ref 0.1–1.0)
Monocytes Relative: 6.4 % (ref 3.0–12.0)
Neutro Abs: 3.3 10*3/uL (ref 1.4–7.7)
Neutrophils Relative %: 59.1 % (ref 43.0–77.0)
Platelets: 291 10*3/uL (ref 150.0–400.0)
RBC: 4.33 Mil/uL (ref 3.87–5.11)
RDW: 13.6 % (ref 11.5–15.5)
WBC: 5.6 10*3/uL (ref 4.0–10.5)

## 2019-07-26 LAB — LIPID PANEL
Cholesterol: 221 mg/dL — ABNORMAL HIGH (ref 0–200)
HDL: 60.2 mg/dL (ref >39.00)
LDL Cholesterol: 133 mg/dL — ABNORMAL HIGH (ref 0–99)
NonHDL: 160.72
Total CHOL/HDL Ratio: 4
Triglycerides: 139 mg/dL (ref 0.0–149.0)
VLDL: 27.8 mg/dL (ref 0.0–40.0)

## 2019-07-26 LAB — TSH: TSH: 2.21 u[IU]/mL (ref 0.35–4.50)

## 2019-07-26 LAB — COMPREHENSIVE METABOLIC PANEL
ALT: 10 U/L (ref 0–35)
AST: 18 U/L (ref 0–37)
Albumin: 4.2 g/dL (ref 3.5–5.2)
Alkaline Phosphatase: 46 U/L (ref 39–117)
BUN: 12 mg/dL (ref 6–23)
CO2: 29 mEq/L (ref 19–32)
Calcium: 9.2 mg/dL (ref 8.4–10.5)
Chloride: 103 mEq/L (ref 96–112)
Creatinine, Ser: 0.68 mg/dL (ref 0.40–1.20)
GFR: 87.77 mL/min (ref >60.00)
Glucose, Bld: 95 mg/dL (ref 70–99)
Potassium: 4 mEq/L (ref 3.5–5.1)
Sodium: 138 mEq/L (ref 135–145)
Total Bilirubin: 0.3 mg/dL (ref 0.2–1.2)
Total Protein: 7.1 g/dL (ref 6.0–8.3)

## 2019-07-30 ENCOUNTER — Other Ambulatory Visit: Payer: Self-pay

## 2019-07-30 ENCOUNTER — Encounter: Payer: Self-pay | Admitting: Family Medicine

## 2019-07-30 ENCOUNTER — Ambulatory Visit (INDEPENDENT_AMBULATORY_CARE_PROVIDER_SITE_OTHER): Payer: BC Managed Care – PPO | Admitting: Family Medicine

## 2019-07-30 VITALS — BP 122/74 | HR 89 | Temp 98.2°F | Ht 62.75 in | Wt 191.1 lb

## 2019-07-30 DIAGNOSIS — E8941 Symptomatic postprocedural ovarian failure: Secondary | ICD-10-CM

## 2019-07-30 DIAGNOSIS — Z6832 Body mass index (BMI) 32.0-32.9, adult: Secondary | ICD-10-CM

## 2019-07-30 DIAGNOSIS — Z23 Encounter for immunization: Secondary | ICD-10-CM

## 2019-07-30 DIAGNOSIS — Z0000 Encounter for general adult medical examination without abnormal findings: Secondary | ICD-10-CM | POA: Diagnosis not present

## 2019-07-30 DIAGNOSIS — E782 Mixed hyperlipidemia: Secondary | ICD-10-CM

## 2019-07-30 DIAGNOSIS — E6609 Other obesity due to excess calories: Secondary | ICD-10-CM

## 2019-07-30 DIAGNOSIS — Z1211 Encounter for screening for malignant neoplasm of colon: Secondary | ICD-10-CM | POA: Diagnosis not present

## 2019-07-30 DIAGNOSIS — F341 Dysthymic disorder: Secondary | ICD-10-CM

## 2019-07-30 DIAGNOSIS — J4520 Mild intermittent asthma, uncomplicated: Secondary | ICD-10-CM

## 2019-07-30 DIAGNOSIS — M5481 Occipital neuralgia: Secondary | ICD-10-CM

## 2019-07-30 MED ORDER — SERTRALINE HCL 100 MG PO TABS: 150.0000 mg | ORAL_TABLET | Freq: Every day | ORAL | 3 refills | Status: DC

## 2019-07-30 MED ORDER — GABAPENTIN 300 MG PO CAPS: ORAL_CAPSULE | ORAL | 3 refills | Status: DC

## 2019-07-30 MED ORDER — ESTRADIOL 0.5 MG PO TABS: 0.5000 mg | ORAL_TABLET | Freq: Every day | ORAL | 11 refills | Status: DC

## 2019-07-30 MED ORDER — BUDESONIDE-FORMOTEROL FUMARATE 80-4.5 MCG/ACT IN AERO: 2.0000 | INHALATION_SPRAY | Freq: Two times a day (BID) | RESPIRATORY_TRACT | 11 refills | Status: DC

## 2019-07-30 NOTE — Assessment & Plan Note (Signed)
Px symbicort since advair is no longer covered inst to update with response, can change dose if needed

## 2019-07-30 NOTE — Assessment & Plan Note (Signed)
Pt continues to have vasomotor and mood symptoms  Asked to change from premarin to estrace  Will try low doxe 0.5 mg daily  She understands risks incl breast cancer and blood clots Will continue to monitor

## 2019-07-30 NOTE — Assessment & Plan Note (Signed)
Reviewed health habits including diet and exercise and skin cancer prevention Reviewed appropriate screening tests for age  Also reviewed health mt list, fam hx and immunization status , as well as social and family history   See HPI Labs reviewed Planning a flu shot in the fall  She will check on coverage for shingrix  ifob kit given  Enc good self care

## 2019-07-30 NOTE — Assessment & Plan Note (Signed)
Continues sertraline and also HRT (understands risks)  Reviewed stressors/ coping techniques/symptoms/ support sources/ tx options and side effects in detail today Enc exercise and good self care

## 2019-07-30 NOTE — Assessment & Plan Note (Signed)
Disc goals for lipids and reasons to control them Rev last labs with pt Rev low sat fat diet in detail  Needs to cut back on baked goods with butter/oil  If no improvement may need to discuss statin

## 2019-07-30 NOTE — Progress Notes (Signed)
Subjective:    Patient ID: Ariel Compton, female    DOB: 06/17/1956, 63 y.o.   MRN: [id number]  HPI Here for health maintenance exam and to review chronic medical problems    Wt Readings from Last 3 Encounters:  07/30/19 191 lb 1 oz (86.7 kg)  10/18/18 184 lb 9.6 oz (83.7 kg)  08/20/18 184 lb 12 oz (83.8 kg)  she walks every day for exercise  Trying to eat more veggies/fruit  34.12 kg/m   Colonoscopy - declines  She is afraid of complications  Did not send in ifob from last year    Flu shot 9/19  Td 9/10  Mammogram 8/20 Self breast exam -no lumps   Has had a hysterectomy  Still takes premarin 0.3 (she wants to switch to estradiol) She is ok with the risk of breast cancer or blood clots   Zoster status -interested in shingrix   Asthma  Ins no longer covers advair   Hyperlipidemia Lab Results  Component Value Date   CHOL 221 (H) 07/26/2019   CHOL 206 (H) 07/20/2018   CHOL 206 (H) 07/14/2017   Lab Results  Component Value Date   HDL 60.20 07/26/2019   HDL 56.50 07/20/2018   HDL 72.90 07/14/2017   Lab Results  Component Value Date   LDLCALC 133 (H) 07/26/2019   LDLCALC 121 (H) 07/20/2018   LDLCALC 108 (H) 07/14/2017   Lab Results  Component Value Date   TRIG 139.0 07/26/2019   TRIG 144.0 07/20/2018   TRIG 126.0 07/14/2017   Lab Results  Component Value Date   CHOLHDL 4 07/26/2019   CHOLHDL 4 07/20/2018   CHOLHDL 3 07/14/2017   Lab Results  Component Value Date   LDLDIRECT 114.0 01/15/2013   LDLDIRECT 121.4 10/25/2007   LDLDIRECT 121.4 10/23/2007   Eating more sugar  Cookies /baked goods  Does exercise  No fried foods No red meat   Other labs Results for orders placed or performed in visit on 07/26/19  TSH  Result Value Ref Range   TSH 2.21 0.35 - 4.50 uIU/mL  Lipid panel  Result Value Ref Range   Cholesterol 221 (H) 0 - 200 mg/dL   Triglycerides 139.0 0.0 - 149.0 mg/dL   HDL 60.20 >39.00 mg/dL   VLDL 27.8 0.0 - 40.0 mg/dL   LDL Cholesterol 133 (H) 0 - 99 mg/dL   Total CHOL/HDL Ratio 4    NonHDL 160.72   Comprehensive metabolic panel  Result Value Ref Range   Sodium 138 135 - 145 mEq/L   Potassium 4.0 3.5 - 5.1 mEq/L   Chloride 103 96 - 112 mEq/L   CO2 29 19 - 32 mEq/L   Glucose, Bld 95 70 - 99 mg/dL   BUN 12 6 - 23 mg/dL   Creatinine, Ser 0.68 0.40 - 1.20 mg/dL   Total Bilirubin 0.3 0.2 - 1.2 mg/dL   Alkaline Phosphatase 46 39 - 117 U/L   AST 18 0 - 37 U/L   ALT 10 0 - 35 U/L   Total Protein 7.1 6.0 - 8.3 g/dL   Albumin 4.2 3.5 - 5.2 g/dL   Calcium 9.2 8.4 - 10.5 mg/dL   GFR 87.77 >60.00 mL/min  CBC with Differential/Platelet  Result Value Ref Range   WBC 5.6 4.0 - 10.5 K/uL   RBC 4.33 3.87 - 5.11 Mil/uL   Hemoglobin 13.0 12.0 - 15.0 g/dL   HCT 38.6 36.0 - 46.0 %   MCV 89.1 78.0 - 100.0  fl   MCHC 33.6 30.0 - 36.0 g/dL   RDW 13.6 11.5 - 15.5 %   Platelets 291.0 150.0 - 400.0 K/uL   Neutrophils Relative % 59.1 43.0 - 77.0 %   Lymphocytes Relative 30.2 12.0 - 46.0 %   Monocytes Relative 6.4 3.0 - 12.0 %   Eosinophils Relative 3.3 0.0 - 5.0 %   Basophils Relative 1.0 0.0 - 3.0 %   Neutro Abs 3.3 1.4 - 7.7 K/uL   Lymphs Abs 1.7 0.7 - 4.0 K/uL   Monocytes Absolute 0.4 0.1 - 1.0 K/uL   Eosinophils Absolute 0.2 0.0 - 0.7 K/uL   Basophils Absolute 0.1 0.0 - 0.1 K/uL      Depression  Sertraline 150 mg daily HRT is helpful for concentration    Patient Active Problem List   Diagnosis Date Noted  . Left groin pain 08/20/2018  . Left leg pain 07/26/2018  . Occipital neuralgia of right side 02/07/2017  . Colon cancer screening 01/22/2013  . Routine general medical examination at a health care facility 11/11/2012  . Other screening mammogram 07/23/2012  . Postmenopausal HRT (hormone replacement therapy) 07/07/2011  . DUPUYTREN'S CONTRACTURE 03/16/2010  . MENOPAUSE, SURGICAL 09/04/2009  . Asthma 08/11/2008  . HYPERLIPIDEMIA 12/04/2007  . Depression 12/04/2007  . ALLERGIC RHINITIS 12/04/2007   . BLADDER PROLAPSE 12/04/2007  . Obesity 10/22/2007   Past Medical History:  Diagnosis Date  . Allergic rhinitis   . Asthma    mild  . Contracture of palmar fascia   . Depression   . Headache   . Mixed hyperlipidemia   . Obesity, unspecified   . Plantar fasciitis   . Symptomatic states associated with artificial menopause   . Unspecified disorder of bladder    Past Surgical History:  Procedure Laterality Date  . ANKLE FRACTURE SURGERY Right   . VAGINAL HYSTERECTOMY  2008   Enterocele repair   Social History   Tobacco Use  . Smoking status: Never Smoker  . Smokeless tobacco: Never Used  Substance Use Topics  . Alcohol use: No    Alcohol/week: 0.0 standard drinks  . Drug use: No   Family History  Problem Relation Age of Onset  . Peripheral vascular disease Father        Smoker  . Prostate cancer Father   . Heart failure Father   . Other Father        Heart "problems"  . Stroke Mother   . Headache Neg Hx   . Breast cancer Neg Hx    No Known Allergies Current Outpatient Medications on File Prior to Visit  Medication Sig Dispense Refill  . albuterol (PROVENTIL HFA;VENTOLIN HFA) 108 (90 Base) MCG/ACT inhaler Inhale 2 puffs into the lungs every 4 (four) hours as needed for wheezing or shortness of breath (or cough). 8 g 11  . Fluticasone-Salmeterol (ADVAIR DISKUS) 100-50 MCG/DOSE AEPB INHALE 1 PUFF INTO THE LUNGS AS NEEDED AS DIRECTED 60 each 11  . Multiple Vitamin (MULTIVITAMIN) tablet Take 1 tablet by mouth daily.      Marland Kitchen zolpidem (AMBIEN) 10 MG tablet Take 1 tablet (10 mg total) by mouth at bedtime as needed for up to 30 days for sleep (during travel). 15 tablet 0   No current facility-administered medications on file prior to visit.     Review of Systems  Constitutional: Negative for activity change, appetite change, fatigue, fever and unexpected weight change.  HENT: Negative for congestion, ear pain, rhinorrhea, sinus pressure and sore throat.  Eyes: Negative  for pain, redness and visual disturbance.  Respiratory: Negative for cough, shortness of breath and wheezing.   Cardiovascular: Negative for chest pain and palpitations.  Gastrointestinal: Negative for abdominal pain, blood in stool, constipation and diarrhea.  Endocrine: Negative for polydipsia and polyuria.  Genitourinary: Negative for dysuria, frequency and urgency.  Musculoskeletal: Negative for arthralgias, back pain and myalgias.  Skin: Negative for pallor and rash.  Allergic/Immunologic: Negative for environmental allergies.  Neurological: Negative for dizziness, syncope and headaches.  Hematological: Negative for adenopathy. Does not bruise/bleed easily.  Psychiatric/Behavioral: Positive for decreased concentration and dysphoric mood. The patient is not nervous/anxious.        Objective:   Physical Exam Constitutional:      General: She is not in acute distress.    Appearance: Normal appearance. She is well-developed. She is obese. She is not ill-appearing or diaphoretic.  HENT:     Head: Normocephalic and atraumatic.     Right Ear: Tympanic membrane, ear canal and external ear normal.     Left Ear: Tympanic membrane, ear canal and external ear normal.     Mouth/Throat:     Mouth: Mucous membranes are moist.     Pharynx: Oropharynx is clear. No posterior oropharyngeal erythema.  Eyes:     General: No scleral icterus.    Conjunctiva/sclera: Conjunctivae normal.     Pupils: Pupils are equal, round, and reactive to light.  Neck:     Musculoskeletal: Normal range of motion and neck supple. No muscular tenderness.     Thyroid: No thyromegaly.     Vascular: No carotid bruit or JVD.  Cardiovascular:     Rate and Rhythm: Normal rate and regular rhythm.     Pulses: Normal pulses.     Heart sounds: Normal heart sounds. No gallop.   Pulmonary:     Effort: Pulmonary effort is normal. No respiratory distress.     Breath sounds: Normal breath sounds. No wheezing.     Comments:  Good air exch Chest:     Chest wall: No tenderness.  Abdominal:     General: Bowel sounds are normal. There is no distension or abdominal bruit.     Palpations: Abdomen is soft. There is no mass.     Tenderness: There is no abdominal tenderness.  Genitourinary:    Comments: Breast exam: No mass, nodules, thickening, tenderness, bulging, retraction, inflamation, nipple discharge or skin changes noted.  No axillary or clavicular LA.     Musculoskeletal: Normal range of motion.        General: No tenderness.     Right lower leg: No edema.  Lymphadenopathy:     Cervical: No cervical adenopathy.  Skin:    General: Skin is warm and dry.     Coloration: Skin is not pale.     Findings: No erythema or rash.     Comments: Solar lentigines diffusely   Neurological:     Mental Status: She is alert.     Cranial Nerves: No cranial nerve deficit.     Motor: No abnormal muscle tone.     Coordination: Coordination normal.     Gait: Gait normal.     Deep Tendon Reflexes: Reflexes are normal and symmetric. Reflexes normal.  Psychiatric:        Mood and Affect: Mood normal.        Cognition and Memory: Cognition and memory normal.           Assessment & Plan:   Problem  List Items Addressed This Visit      Respiratory   Asthma    Px symbicort since advair is no longer covered inst to update with response, can change dose if needed        Relevant Medications   budesonide-formoterol (SYMBICORT) 80-4.5 MCG/ACT inhaler     Other   HYPERLIPIDEMIA    Disc goals for lipids and reasons to control them Rev last labs with pt Rev low sat fat diet in detail  Needs to cut back on baked goods with butter/oil  If no improvement may need to discuss statin       Obesity    Discussed how this problem influences overall health and the risks it imposes  Reviewed plan for weight loss with lower calorie diet (via better food choices and also portion control or program like weight watchers) and  exercise building up to or more than 30 minutes 5 days per week including some aerobic activity         Depression    Continues sertraline and also HRT (understands risks)  Reviewed stressors/ coping techniques/symptoms/ support sources/ tx options and side effects in detail today Enc exercise and good self care       Relevant Medications   sertraline (ZOLOFT) 100 MG tablet   MENOPAUSE, SURGICAL    Pt continues to have vasomotor and mood symptoms  Asked to change from premarin to estrace  Will try low doxe 0.5 mg daily  She understands risks incl breast cancer and blood clots Will continue to monitor      Routine general medical examination at a health care facility - Primary    Reviewed health habits including diet and exercise and skin cancer prevention Reviewed appropriate screening tests for age  Also reviewed health mt list, fam hx and immunization status , as well as social and family history   See HPI Labs reviewed Planning a flu shot in the fall  She will check on coverage for shingrix  ifob kit given  Enc good self care      Colon cancer screening    ifob kit given  Declines colonoscopy at this time      Occipital neuralgia of right side    Continues gabapentin       Other Visit Diagnoses    Need for Tdap vaccination       Relevant Orders   Tdap vaccine greater than or equal to 7yo IM (Completed)

## 2019-07-30 NOTE — Patient Instructions (Addendum)
Try to get most of your carbohydrates from produce (with the exception of white potatoes)  Eat less bread/pasta/rice/snack foods/cereals/sweets and other items from the middle of the grocery store (processed carbs)   Please do the ifob kit   Get your flu shot in the fall   Call your insurance and see if shingrix vaccine is covered  If affordable-call us to get it

## 2019-07-30 NOTE — Assessment & Plan Note (Signed)
Discussed how this problem influences overall health and the risks it imposes  Reviewed plan for weight loss with lower calorie diet (via better food choices and also portion control or program like weight watchers) and exercise building up to or more than 30 minutes 5 days per week including some aerobic activity    

## 2019-07-30 NOTE — Assessment & Plan Note (Signed)
Continues gabapentin.  

## 2019-07-30 NOTE — Assessment & Plan Note (Signed)
ifob kit given  Declines colonoscopy at this time

## 2019-08-02 ENCOUNTER — Encounter: Payer: Self-pay | Admitting: Family Medicine

## 2019-08-02 ENCOUNTER — Other Ambulatory Visit: Payer: Self-pay

## 2019-08-02 MED ORDER — ZOLPIDEM TARTRATE 10 MG PO TABS: 10.0000 mg | ORAL_TABLET | Freq: Every evening | ORAL | 0 refills | Status: DC | PRN

## 2019-08-02 NOTE — Telephone Encounter (Signed)
Ambien last filled 03/28/2019 #15... please advise

## 2019-08-06 ENCOUNTER — Other Ambulatory Visit: Payer: Self-pay | Admitting: Family Medicine

## 2019-08-12 ENCOUNTER — Other Ambulatory Visit: Payer: Self-pay | Admitting: Family Medicine

## 2019-08-20 ENCOUNTER — Other Ambulatory Visit: Payer: Self-pay | Admitting: Family Medicine

## 2019-08-21 DIAGNOSIS — M720 Palmar fascial fibromatosis [Dupuytren]: Secondary | ICD-10-CM | POA: Diagnosis not present

## 2019-08-23 ENCOUNTER — Encounter: Payer: Self-pay | Admitting: Family Medicine

## 2019-08-25 ENCOUNTER — Encounter: Payer: Self-pay | Admitting: Family Medicine

## 2019-09-17 ENCOUNTER — Encounter: Payer: Self-pay | Admitting: Family Medicine

## 2019-09-17 MED ORDER — GABAPENTIN 600 MG PO TABS: 600.0000 mg | ORAL_TABLET | Freq: Three times a day (TID) | ORAL | 3 refills | Status: DC

## 2019-09-23 ENCOUNTER — Other Ambulatory Visit: Payer: Self-pay

## 2019-09-23 ENCOUNTER — Ambulatory Visit
Admission: EM | Admit: 2019-09-23 | Discharge: 2019-09-23 | Disposition: A | Discharge status: Home / Self Care | Payer: BC Managed Care – PPO | Attending: Family Medicine | Admitting: Family Medicine

## 2019-09-23 ENCOUNTER — Ambulatory Visit (INDEPENDENT_AMBULATORY_CARE_PROVIDER_SITE_OTHER): Payer: BC Managed Care – PPO

## 2019-09-23 DIAGNOSIS — S62627A Displaced fracture of medial phalanx of left little finger, initial encounter for closed fracture: Secondary | ICD-10-CM | POA: Diagnosis not present

## 2019-09-23 DIAGNOSIS — M79645 Pain in left finger(s): Secondary | ICD-10-CM | POA: Diagnosis not present

## 2019-09-23 DIAGNOSIS — S62657A Nondisplaced fracture of medial phalanx of left little finger, initial encounter for closed fracture: Secondary | ICD-10-CM | POA: Diagnosis not present

## 2019-09-23 DIAGNOSIS — W01198A Fall on same level from slipping, tripping and stumbling with subsequent striking against other object, initial encounter: Secondary | ICD-10-CM | POA: Diagnosis not present

## 2019-09-23 NOTE — Discharge Instructions (Addendum)
Take over-the-counter Tylenol or ibuprofen as needed.  Keep in splint and buddy tape.  Ice and elevate.  Follow up with your primary care physician in one week for follow up.   Return to Urgent care for new or worsening concerns.

## 2019-09-23 NOTE — ED Provider Notes (Signed)
[facility] ____________________________________________  Time seen: Approximately 9:18 AM  I have reviewed the triage vital signs and the nursing notes.   HISTORY  Chief Complaint Hand Injury   HPI Ariel Compton is a 63 y.o. female presenting for evaluation of left fifth digit pain post injury that occurred yesterday.  Patient [place] causing her to fall forward.  States she caught herself with the left hand injuring her left pinky finger.  Pain and swelling since.  Denies any other pain or injuries.  States pain currently mild, increases with activity.  Reports otherwise doing well.  No recent sickness.  [name], [name], MD: PCP    Past Medical History:  Diagnosis Date  . Allergic rhinitis   . Asthma    mild  . Contracture of palmar fascia   . Depression   . Headache   . Mixed hyperlipidemia   . Obesity, unspecified   . Plantar fasciitis   . Symptomatic states associated with artificial menopause   . Unspecified disorder of bladder     Patient Active Problem List   Diagnosis Date Noted  . Left groin pain 08/20/2018  . Left leg pain 07/26/2018  . Occipital neuralgia of right side 02/07/2017  . Colon cancer screening 01/22/2013  . Routine general medical examination at a health care facility 11/11/2012  . Other screening mammogram 07/23/2012  . Postmenopausal HRT (hormone replacement therapy) 07/07/2011  . DUPUYTREN'S CONTRACTURE 03/16/2010  . MENOPAUSE, SURGICAL 09/04/2009  . Asthma 08/11/2008  . HYPERLIPIDEMIA 12/04/2007  . Depression 12/04/2007  . ALLERGIC RHINITIS 12/04/2007  . BLADDER PROLAPSE 12/04/2007  . Obesity 10/22/2007    Past Surgical History:  Procedure Laterality Date  . ANKLE FRACTURE SURGERY Right   . VAGINAL HYSTERECTOMY  2008   Enterocele repair     No current facility-administered medications for this encounter.   Current Outpatient Medications:  .  albuterol  (PROVENTIL HFA;VENTOLIN HFA) 108 (90 Base) MCG/ACT inhaler, Inhale 2 puffs into the lungs every 4 (four) hours as needed for wheezing or shortness of breath (or cough)., Disp: 8 g, Rfl: 11 .  budesonide-formoterol (SYMBICORT) 80-4.5 MCG/ACT inhaler, Inhale 2 puffs into the lungs 2 (two) times daily., Disp: 1 Inhaler, Rfl: 11 .  estradiol (ESTRACE) 0.5 MG tablet, Take 1 tablet (0.5 mg total) by mouth daily., Disp: 30 tablet, Rfl: 11 .  Fluticasone-Salmeterol (ADVAIR DISKUS) 100-50 MCG/DOSE AEPB, INHALE 1 PUFF INTO THE LUNGS AS NEEDED AS DIRECTED, Disp: 60 each, Rfl: 11 .  gabapentin (NEURONTIN) 600 MG tablet, Take 1 tablet (600 mg total) by mouth 3 (three) times daily., Disp: 270 tablet, Rfl: 3 .  Multiple Vitamin (MULTIVITAMIN) tablet, Take 1 tablet by mouth daily.  , Disp: , Rfl:  .  sertraline (ZOLOFT) 100 MG tablet, Take 1.5 tablets (150 mg total) by mouth daily., Disp: 135 tablet, Rfl: 3 .  zolpidem (AMBIEN) 10 MG tablet, Take 1 tablet (10 mg total) by mouth at bedtime as needed for sleep (during travel)., Disp: 15 tablet, Rfl: 0  Allergies Patient has no known allergies.  Family History  Problem Relation Age of Onset  . Peripheral vascular disease Father        Smoker  . Prostate cancer Father   . Heart failure Father   . Other Father        Heart "problems"  . Stroke Mother   . Headache Neg Hx   . Breast cancer Neg Hx  Social History Social History   Tobacco Use  . Smoking status: Never Smoker  . Smokeless tobacco: Never Used  Substance Use Topics  . Alcohol use: No    Alcohol/week: 0.0 standard drinks  . Drug use: No    Review of Systems Constitutional: No fever ENT: No sore throat. Cardiovascular: Denies chest pain. Respiratory: Denies shortness of breath. Gastrointestinal: No abdominal pain.  Musculoskeletal: Positive left fifth finger pain. Skin: Negative for rash.   ____________________________________________   PHYSICAL EXAM:  VITAL SIGNS: ED Triage  Vitals  Enc Vitals Group     BP 09/23/19 0906 (!) 121/93     Pulse Rate 09/23/19 0906 73     Resp 09/23/19 0906 17     Temp 09/23/19 0906 98.5 F (36.9 C)     Temp Source 09/23/19 0906 Oral     SpO2 09/23/19 0906 97 %     Weight 09/23/19 0906 183 lb (83 kg)     Height 09/23/19 0906 5\' 4"  (1.626 m)     Head Circumference --      Peak Flow --      Pain Score 09/23/19 0905 8     Pain Loc --      Pain Edu? --      Excl. in [place]? --     Constitutional: Alert and oriented. Well appearing and in no acute distress. Eyes: Conjunctivae are normal.  ENT      Head: Normocephalic and atraumatic. Cardiovascular: Normal rate, regular rhythm. Grossly normal heart sounds.  Good peripheral circulation. Respiratory: Normal respiratory effort without tachypnea nor retractions. Breath sounds are clear and equal bilaterally. No wheezes, rales, rhonchi. Musculoskeletal: Steady gait. Except: Left hand fifth digit mid to proximal finger tenderness to direct palpation with edema and ecchymosis, good resisted distal flexion and extension with normal sensation, left hand otherwise nontender. Neurologic:  Normal speech and language.  Skin:  Skin is warm, dry and intact. No rash noted. Psychiatric: Mood and affect are normal. Speech and behavior are normal. Patient exhibits appropriate insight and judgment   ___________________________________________   LABS (all labs ordered are listed, but only abnormal results are displayed)  Labs Reviewed - No data to display ____________________________________________  RADIOLOGY  Dg Finger Little Left  Result Date: 09/23/2019 CLINICAL DATA:  Fall, left little finger pain. EXAM: LEFT LITTLE FINGER 2+V COMPARISON:  None. FINDINGS: There is a fracture at the dorsal base of the left little finger middle phalanx, mildly displaced. No subluxation or dislocation. No additional acute bony abnormality. IMPRESSION: Fracture of the dorsal base of the left little finger middle  phalanx Electronically Signed   By: Rolm Baptise M.D.   On: 09/23/2019 09:33   ____________________________________________   PROCEDURES Procedures     INITIAL IMPRESSION / ASSESSMENT AND PLAN / ED COURSE  Pertinent labs & imaging results that were available during my care of the patient were reviewed by me and considered in my medical decision making (see chart for details).  Well-appearing patient.  No acute distress.  Mechanical fall left hand, left fifth digit pain.  Left fifth digit x-ray as above, fracture of the dorsal base of fifth digit.  Finger splint and buddy taping applied.  Supportive care.  Discussed follow up with Primary care physician this week. Discussed follow up and return parameters including no resolution or any worsening concerns. Patient verbalized understanding and agreed to plan.   ____________________________________________   FINAL CLINICAL IMPRESSION(S) / ED DIAGNOSES  Final diagnoses:  Closed displaced fracture of  middle phalanx of left little finger, initial encounter     ED Discharge Orders    None       Note: This dictation was prepared with Dragon dictation along with smaller phrase technology. Any transcriptional errors that result from this process are unintentional.         Renford Dills, NP 09/23/19 1027

## 2019-09-23 NOTE — ED Triage Notes (Signed)
Pt fell yesterday and braced herself with her left hand. Bruising and swelling and pain mostly to 5th finger left hand. Some bruising noted to base of finger into palmar region.

## 2019-12-11 ENCOUNTER — Ambulatory Visit: Payer: BC Managed Care – PPO | Admitting: Family Medicine

## 2019-12-11 ENCOUNTER — Encounter: Payer: Self-pay | Admitting: Family Medicine

## 2019-12-11 ENCOUNTER — Other Ambulatory Visit: Payer: Self-pay

## 2019-12-11 ENCOUNTER — Telehealth: Payer: Self-pay | Admitting: *Deleted

## 2019-12-11 DIAGNOSIS — M7060 Trochanteric bursitis, unspecified hip: Secondary | ICD-10-CM | POA: Insufficient documentation

## 2019-12-11 DIAGNOSIS — M7061 Trochanteric bursitis, right hip: Secondary | ICD-10-CM

## 2019-12-11 MED ORDER — ZOLPIDEM TARTRATE 10 MG PO TABS: 10.0000 mg | ORAL_TABLET | Freq: Every evening | ORAL | 0 refills | Status: DC | PRN

## 2019-12-11 MED ORDER — METAXALONE 800 MG PO TABS: 400.0000 mg | ORAL_TABLET | Freq: Three times a day (TID) | ORAL | 1 refills | Status: DC | PRN

## 2019-12-11 NOTE — Assessment & Plan Note (Signed)
R sided  May have been caused by position in car ride Now much improved after ibuprofen use for a day inst to continue 800 mg with food tid prn Ice when able  Walking (avoiding excessive stairs/hills for several days) Given handout with rehab /exercises to try Rev old hip films-re assuring  Px skelaxin for use prn/spasm (caution of sedation) Update if not starting to improve in a week or if worsening

## 2019-12-11 NOTE — Telephone Encounter (Signed)
PA for pt's skelaxin done at [email or web address], I will await a response

## 2019-12-11 NOTE — Patient Instructions (Addendum)
Look into yoga when this hip problem settles down   I think you have hip (trochanteric) bursitis  Use ice as often as possible  Ibuprofen with food 800 mg every 8 hours with food as needed   If it worsens again please let me know

## 2019-12-11 NOTE — Progress Notes (Signed)
Subjective:    Patient ID: Ariel NeighborsAlana A Compton, female    DOB: [birth year], 63 y.o.   MRN: [id number]  This visit occurred during the SARS-CoV-2 public health emergency.  Safety protocols were in place, including screening questions prior to the visit, additional usage of staff PPE, and extensive cleaning of exam room while observing appropriate contact time as indicated for disinfecting solutions.    HPI Pt presents with R hip pain   Started on Friday - had gone to [place] and then noted it with walking  Pain in outer hip (sharp and dull and burning)  Pain rad to her foot to walk up the steps  No n/t or loss of strength  No foot drop  Got worse and worse   No pain in her back   Today - was sore this am  Took ibuprofen 800 mg (started last night)   and metaxolone    Now improved  Wt Readings from Last 3 Encounters:  12/11/19 193 lb 9 oz (87.8 kg)  09/23/19 183 lb (83 kg)  07/30/19 191 lb 1 oz (86.7 kg)   34.56 kg/m   Past hip film of L hip (partially visualizing R) - no deg changes in 2019   Patient Active Problem List   Diagnosis Date Noted  . Trochanteric bursitis 12/11/2019  . Left groin pain 08/20/2018  . Left leg pain 07/26/2018  . Occipital neuralgia of right side 02/07/2017  . Colon cancer screening 01/22/2013  . Routine general medical examination at a health care facility 11/11/2012  . Other screening mammogram 07/23/2012  . Postmenopausal HRT (hormone replacement therapy) 07/07/2011  . DUPUYTREN'S CONTRACTURE 03/16/2010  . MENOPAUSE, SURGICAL 09/04/2009  . Asthma 08/11/2008  . HYPERLIPIDEMIA 12/04/2007  . Depression 12/04/2007  . ALLERGIC RHINITIS 12/04/2007  . BLADDER PROLAPSE 12/04/2007  . Obesity 10/22/2007   Past Medical History:  Diagnosis Date  . Allergic rhinitis   . Asthma    mild  . Contracture of palmar fascia   . Depression   . Headache   . Mixed hyperlipidemia   . Obesity, unspecified   . Plantar fasciitis   . Symptomatic states  associated with artificial menopause   . Unspecified disorder of bladder    Past Surgical History:  Procedure Laterality Date  . ANKLE FRACTURE SURGERY Right   . VAGINAL HYSTERECTOMY  2008   Enterocele repair   Social History   Tobacco Use  . Smoking status: Never Smoker  . Smokeless tobacco: Never Used  Substance Use Topics  . Alcohol use: No    Alcohol/week: 0.0 standard drinks  . Drug use: No   Family History  Problem Relation Age of Onset  . Peripheral vascular disease Father        Smoker  . Prostate cancer Father   . Heart failure Father   . Other Father        Heart "problems"  . Stroke Mother   . Headache Neg Hx   . Breast cancer Neg Hx    No Known Allergies Current Outpatient Medications on File Prior to Visit  Medication Sig Dispense Refill  . albuterol (PROVENTIL HFA;VENTOLIN HFA) 108 (90 Base) MCG/ACT inhaler Inhale 2 puffs into the lungs every 4 (four) hours as needed for wheezing or shortness of breath (or cough). 8 g 11  . budesonide-formoterol (SYMBICORT) 80-4.5 MCG/ACT inhaler Inhale 2 puffs into the lungs 2 (two) times daily. 1 Inhaler 11  . estradiol (ESTRACE) 0.5 MG tablet Take 1 tablet (0.5 mg  total) by mouth daily. 30 tablet 11  . Fluticasone-Salmeterol (ADVAIR DISKUS) 100-50 MCG/DOSE AEPB INHALE 1 PUFF INTO THE LUNGS AS NEEDED AS DIRECTED 60 each 11  . gabapentin (NEURONTIN) 600 MG tablet Take 1 tablet (600 mg total) by mouth 3 (three) times daily. 270 tablet 3  . Multiple Vitamin (MULTIVITAMIN) tablet Take 1 tablet by mouth daily.      . sertraline (ZOLOFT) 100 MG tablet Take 1.5 tablets (150 mg total) by mouth daily. 135 tablet 3   No current facility-administered medications on file prior to visit.     Review of Systems  Constitutional: Negative for activity change, appetite change, fatigue, fever and unexpected weight change.  HENT: Negative for congestion, ear pain, rhinorrhea, sinus pressure and sore throat.   Eyes: Negative for pain,  redness and visual disturbance.  Respiratory: Negative for cough, shortness of breath and wheezing.   Cardiovascular: Negative for chest pain and palpitations.  Gastrointestinal: Negative for abdominal pain, blood in stool, constipation and diarrhea.  Endocrine: Negative for polydipsia and polyuria.  Genitourinary: Negative for dysuria, frequency and urgency.  Musculoskeletal: Negative for arthralgias, back pain and myalgias.       R hip pain   Skin: Negative for pallor and rash.  Allergic/Immunologic: Negative for environmental allergies.  Neurological: Negative for dizziness, syncope and headaches.  Hematological: Negative for adenopathy. Does not bruise/bleed easily.  Psychiatric/Behavioral: Negative for decreased concentration and dysphoric mood. The patient is not nervous/anxious.        Objective:   Physical Exam Constitutional:      General: She is not in acute distress.    Appearance: Normal appearance. She is well-developed. She is obese. She is not ill-appearing.  HENT:     Head: Normocephalic and atraumatic.  Eyes:     General: No scleral icterus.    Conjunctiva/sclera: Conjunctivae normal.     Pupils: Pupils are equal, round, and reactive to light.  Cardiovascular:     Rate and Rhythm: Normal rate and regular rhythm.  Pulmonary:     Effort: Pulmonary effort is normal.     Breath sounds: Normal breath sounds. No wheezing or rales.  Abdominal:     General: Bowel sounds are normal. There is no distension.     Palpations: Abdomen is soft.     Tenderness: There is no abdominal tenderness.  Musculoskeletal:        General: Tenderness present.     Cervical back: Normal range of motion and neck supple.     Lumbar back: No edema, spasms, tenderness or bony tenderness. Normal range of motion.     Right hip: Tenderness and bony tenderness present. No deformity or crepitus. Normal range of motion. Normal strength.     Right lower leg: No edema.     Left lower leg: No edema.       Comments: Tenderness over R greater trochanter w/o swelling or skin change  Pain with full internal rotation and flexion of hip  No popping or crepitus  Nl gait    Lymphadenopathy:     Cervical: No cervical adenopathy.  Skin:    General: Skin is warm and dry.     Coloration: Skin is not pale.     Findings: No erythema or rash.  Neurological:     Mental Status: She is alert.     Cranial Nerves: No cranial nerve deficit.     Sensory: No sensory deficit.     Motor: No atrophy or abnormal muscle tone.  Coordination: Coordination normal.     Deep Tendon Reflexes: Reflexes are normal and symmetric. Reflexes normal.     Comments: Negative SLR  Psychiatric:        Mood and Affect: Mood normal.           Assessment & Plan:   Problem List Items Addressed This Visit      Musculoskeletal and Integument   Trochanteric bursitis    R sided  May have been caused by position in car ride Now much improved after ibuprofen use for a day inst to continue 800 mg with food tid prn Ice when able  Walking (avoiding excessive stairs/hills for several days) Given handout with rehab /exercises to try Rev old hip films-re assuring  Px skelaxin for use prn/spasm (caution of sedation) Update if not starting to improve in a week or if worsening

## 2019-12-13 ENCOUNTER — Encounter: Payer: Self-pay | Admitting: Family Medicine

## 2019-12-16 MED ORDER — METHOCARBAMOL 500 MG PO TABS: 500.0000 mg | ORAL_TABLET | Freq: Three times a day (TID) | ORAL | 1 refills | Status: DC | PRN

## 2020-03-02 ENCOUNTER — Encounter: Payer: Self-pay | Admitting: Family Medicine

## 2020-03-02 ENCOUNTER — Other Ambulatory Visit: Payer: Self-pay | Admitting: Family Medicine

## 2020-03-02 MED ORDER — ZOLPIDEM TARTRATE 10 MG PO TABS: 10.0000 mg | ORAL_TABLET | Freq: Every evening | ORAL | 0 refills | Status: DC | PRN

## 2020-03-02 NOTE — Telephone Encounter (Signed)
Last office visit 12/11/2019 for trochanteric bursitis.  Last refilled 12/11/2019 for #15 with no refills.  CPE scheduled for 08/04/2020.

## 2020-03-02 NOTE — Telephone Encounter (Signed)
See refill request.

## 2020-04-10 ENCOUNTER — Encounter: Payer: Self-pay | Admitting: Family Medicine

## 2020-04-10 MED ORDER — METHOCARBAMOL 500 MG PO TABS: 500.0000 mg | ORAL_TABLET | Freq: Three times a day (TID) | ORAL | 1 refills | Status: DC | PRN

## 2020-04-13 ENCOUNTER — Other Ambulatory Visit: Payer: Self-pay | Admitting: *Deleted

## 2020-04-13 MED ORDER — METHOCARBAMOL 500 MG PO TABS: 500.0000 mg | ORAL_TABLET | Freq: Three times a day (TID) | ORAL | 0 refills | Status: DC | PRN

## 2020-04-13 NOTE — Telephone Encounter (Signed)
Fax refill request, last filled on 04/10/20 #30 tabs with 1 refills, CPE scheduled for 08/04/20, please advise   [facility] [address] (on file)

## 2020-05-26 ENCOUNTER — Encounter: Payer: Self-pay | Admitting: Family Medicine

## 2020-05-26 MED ORDER — ZOLPIDEM TARTRATE 10 MG PO TABS: 10.0000 mg | ORAL_TABLET | Freq: Every evening | ORAL | 0 refills | Status: DC | PRN

## 2020-06-08 ENCOUNTER — Other Ambulatory Visit: Payer: Self-pay | Admitting: Family Medicine

## 2020-06-08 DIAGNOSIS — Z1231 Encounter for screening mammogram for malignant neoplasm of breast: Secondary | ICD-10-CM

## 2020-07-08 ENCOUNTER — Encounter: Payer: Self-pay | Admitting: Family Medicine

## 2020-07-08 MED ORDER — ESTRADIOL 0.5 MG PO TABS: 0.5000 mg | ORAL_TABLET | Freq: Every day | ORAL | 0 refills | Status: DC

## 2020-07-13 ENCOUNTER — Other Ambulatory Visit: Payer: Self-pay | Admitting: Family Medicine

## 2020-07-20 ENCOUNTER — Encounter: Payer: Self-pay | Admitting: Family Medicine

## 2020-07-23 ENCOUNTER — Other Ambulatory Visit: Payer: Self-pay

## 2020-07-23 ENCOUNTER — Ambulatory Visit
Admission: RE | Admit: 2020-07-23 | Discharge: 2020-07-23 | Disposition: A | Discharge status: Home / Self Care | Payer: BC Managed Care – PPO | Source: Ambulatory Visit | Attending: Family Medicine | Admitting: Family Medicine

## 2020-07-23 DIAGNOSIS — Z1231 Encounter for screening mammogram for malignant neoplasm of breast: Secondary | ICD-10-CM

## 2020-07-30 ENCOUNTER — Telehealth: Payer: Self-pay | Admitting: Family Medicine

## 2020-07-30 DIAGNOSIS — E782 Mixed hyperlipidemia: Secondary | ICD-10-CM

## 2020-07-30 DIAGNOSIS — Z0000 Encounter for general adult medical examination without abnormal findings: Secondary | ICD-10-CM

## 2020-07-30 NOTE — Telephone Encounter (Signed)
-----   Message from Aquilla Solian, RT sent at 07/13/2020  3:12 PM EDT ----- Regarding: Lab Orders for Friday 8.20.2021 Please place lab orders for Friday 8.20.2021, office visit for physical on Tuesday 8.24.2021 Thank you, Jones Bales RT(R)

## 2020-07-31 ENCOUNTER — Other Ambulatory Visit: Payer: Self-pay

## 2020-07-31 ENCOUNTER — Other Ambulatory Visit (INDEPENDENT_AMBULATORY_CARE_PROVIDER_SITE_OTHER): Payer: BC Managed Care – PPO

## 2020-07-31 DIAGNOSIS — Z0000 Encounter for general adult medical examination without abnormal findings: Secondary | ICD-10-CM | POA: Diagnosis not present

## 2020-07-31 DIAGNOSIS — E782 Mixed hyperlipidemia: Secondary | ICD-10-CM

## 2020-07-31 LAB — CBC WITH DIFFERENTIAL/PLATELET
Basophils Absolute: 0.1 10*3/uL (ref 0.0–0.1)
Basophils Relative: 1.1 % (ref 0.0–3.0)
Eosinophils Absolute: 0.1 10*3/uL (ref 0.0–0.7)
Eosinophils Relative: 2.3 % (ref 0.0–5.0)
HCT: 40.4 % (ref 36.0–46.0)
Hemoglobin: 13.4 g/dL (ref 12.0–15.0)
Lymphocytes Relative: 26.1 % (ref 12.0–46.0)
Lymphs Abs: 1.6 10*3/uL (ref 0.7–4.0)
MCHC: 33.3 g/dL (ref 30.0–36.0)
MCV: 89.1 fl (ref 78.0–100.0)
Monocytes Absolute: 0.5 10*3/uL (ref 0.1–1.0)
Monocytes Relative: 7.4 % (ref 3.0–12.0)
Neutro Abs: 4 10*3/uL (ref 1.4–7.7)
Neutrophils Relative %: 63.1 % (ref 43.0–77.0)
Platelets: 272 10*3/uL (ref 150.0–400.0)
RBC: 4.54 Mil/uL (ref 3.87–5.11)
RDW: 13.8 % (ref 11.5–15.5)
WBC: 6.3 10*3/uL (ref 4.0–10.5)

## 2020-07-31 LAB — LIPID PANEL
Cholesterol: 217 mg/dL — ABNORMAL HIGH (ref 0–200)
HDL: 57.3 mg/dL (ref >39.00)
LDL Cholesterol: 134 mg/dL — ABNORMAL HIGH (ref 0–99)
NonHDL: 159.59
Total CHOL/HDL Ratio: 4
Triglycerides: 130 mg/dL (ref 0.0–149.0)
VLDL: 26 mg/dL (ref 0.0–40.0)

## 2020-07-31 LAB — COMPREHENSIVE METABOLIC PANEL
ALT: 10 U/L (ref 0–35)
AST: 17 U/L (ref 0–37)
Albumin: 4.2 g/dL (ref 3.5–5.2)
Alkaline Phosphatase: 52 U/L (ref 39–117)
BUN: 23 mg/dL (ref 6–23)
CO2: 29 mEq/L (ref 19–32)
Calcium: 9.6 mg/dL (ref 8.4–10.5)
Chloride: 104 mEq/L (ref 96–112)
Creatinine, Ser: 0.73 mg/dL (ref 0.40–1.20)
GFR: 80.6 mL/min (ref >60.00)
Glucose, Bld: 101 mg/dL — ABNORMAL HIGH (ref 70–99)
Potassium: 4.4 mEq/L (ref 3.5–5.1)
Sodium: 141 mEq/L (ref 135–145)
Total Bilirubin: 0.3 mg/dL (ref 0.2–1.2)
Total Protein: 7 g/dL (ref 6.0–8.3)

## 2020-07-31 LAB — TSH: TSH: 3.13 u[IU]/mL (ref 0.35–4.50)

## 2020-08-04 ENCOUNTER — Other Ambulatory Visit: Payer: Self-pay

## 2020-08-04 ENCOUNTER — Encounter: Payer: Self-pay | Admitting: Family Medicine

## 2020-08-04 ENCOUNTER — Ambulatory Visit (INDEPENDENT_AMBULATORY_CARE_PROVIDER_SITE_OTHER): Payer: BC Managed Care – PPO | Admitting: Family Medicine

## 2020-08-04 VITALS — BP 116/74 | HR 75 | Temp 96.9°F | Ht 62.75 in | Wt 182.1 lb

## 2020-08-04 DIAGNOSIS — Z1211 Encounter for screening for malignant neoplasm of colon: Secondary | ICD-10-CM | POA: Diagnosis not present

## 2020-08-04 DIAGNOSIS — E6609 Other obesity due to excess calories: Secondary | ICD-10-CM

## 2020-08-04 DIAGNOSIS — J4520 Mild intermittent asthma, uncomplicated: Secondary | ICD-10-CM

## 2020-08-04 DIAGNOSIS — E782 Mixed hyperlipidemia: Secondary | ICD-10-CM | POA: Diagnosis not present

## 2020-08-04 DIAGNOSIS — Z6832 Body mass index (BMI) 32.0-32.9, adult: Secondary | ICD-10-CM

## 2020-08-04 DIAGNOSIS — Z0000 Encounter for general adult medical examination without abnormal findings: Secondary | ICD-10-CM

## 2020-08-04 DIAGNOSIS — F341 Dysthymic disorder: Secondary | ICD-10-CM

## 2020-08-04 MED ORDER — GABAPENTIN 600 MG PO TABS
600.0000 mg | ORAL_TABLET | Freq: Three times a day (TID) | ORAL | 3 refills | Status: DC
Start: 2020-08-04 — End: 2021-08-05

## 2020-08-04 MED ORDER — TRAZODONE HCL 50 MG PO TABS: 50.0000 mg | ORAL_TABLET | Freq: Every evening | ORAL | 3 refills | Status: DC | PRN

## 2020-08-04 MED ORDER — SERTRALINE HCL 100 MG PO TABS
150.0000 mg | ORAL_TABLET | Freq: Every day | ORAL | 3 refills | Status: DC
Start: 2020-08-04 — End: 2021-08-05

## 2020-08-04 MED ORDER — BUDESONIDE-FORMOTEROL FUMARATE 80-4.5 MCG/ACT IN AERO
2.0000 | INHALATION_SPRAY | Freq: Two times a day (BID) | RESPIRATORY_TRACT | 3 refills | Status: DC
Start: 2020-08-04 — End: 2021-08-05

## 2020-08-04 NOTE — Patient Instructions (Addendum)
Get started with counseling through work  Try the trazodone for sleep and depression  If side effects or worse-stop it and let me know   Get your flu shot in the fall   If you are interested in the shingles vaccine series (Shingrix), call your insurance or pharmacy to check on coverage and location it must be given.  If affordable - you can schedule it here or at your pharmacy depending on coverage   Let us know if cologuard is covered and we can sign you up

## 2020-08-04 NOTE — Assessment & Plan Note (Signed)
Disc goals for lipids and reasons to control them Rev last labs with pt Rev low sat fat diet in detail  Overall a healthy eater  May consider statin if this goes up in the future

## 2020-08-04 NOTE — Assessment & Plan Note (Signed)
symbicort keeps her in good control

## 2020-08-04 NOTE — Assessment & Plan Note (Signed)
Pt declines colonoscopy  Considered virtual colonoscopy for 900 $ out of pocket  Will check into cost of cologuard and let us know which she prefers Disc pros/cons

## 2020-08-04 NOTE — Assessment & Plan Note (Signed)
Discussed how this problem influences overall health and the risks it imposes  Reviewed plan for weight loss with lower calorie diet (via better food choices and also portion control or program like weight watchers) and exercise building up to or more than 30 minutes 5 days per week including some aerobic activity    

## 2020-08-04 NOTE — Assessment & Plan Note (Signed)
Worse lately with stressors (losses, husband taking a break after loss of his mother)  Reviewed stressors/ coping techniques/symptoms/ support sources/ tx options and side effects in detail today  Will add trazadone to help with mood and sleep  Disc poss side eff-she will update  Can titrate dose if needed  Continues sertraline Enc exercise and good self care

## 2020-08-04 NOTE — Assessment & Plan Note (Signed)
Reviewed health habits including diet and exercise and skin cancer prevention Reviewed appropriate screening tests for age  Also reviewed health mt list, fam hx and immunization status , as well as social and family history   See HPI Labs reviewed  Commended wt loss  Declines colonoscopy- will check on cologuard cost and let us know  Disc tx for depression

## 2020-08-04 NOTE — Progress Notes (Signed)
Subjective:    Patient ID: Ariel Compton, female    DOB: [birth year], 64 y.o.   MRN: [id number]  This visit occurred during the SARS-CoV-2 public health emergency.  Safety protocols were in place, including screening questions prior to the visit, additional usage of staff PPE, and extensive cleaning of exam room while observing appropriate contact time as indicated for disinfecting solutions.    HPI Here for health maintenance exam and to review chronic medical problems    Wt Readings from Last 3 Encounters:  08/04/20 182 lb 1 oz (82.6 kg)  12/11/19 193 lb 9 oz (87.8 kg)  09/23/19 183 lb (83 kg)  has been doing intermittent fasting for a year (16/8)  32.51 kg/m   Exercise- walking    Lot of stress  Husband's mom died - has had trouble /going away for 3 mo (lot of anger)  Her mom had another stroke   It has worsened her depression (sad and anxious both) No suicidal thoughts  Not hopeless  Taking sertraline  Cannot sleep  Plans to start counseling at [facility] -at work   (she works with therapists also)  Melatonin does not help  ambien helps- but does not want to take every day (1/2 tab every 3 days)    Flu shot - gets every fall  pna vaccine 2/14  Tdap 8/20  Zoster status--interested in shingrix  covid status -vaccinated   Mammogram 8/21 Self breast exam -no lumps or changes   Gyn care -has had a hysterectomy  Takes estrace 0.5 mg daily -wants to stay on that  Understands the risks of estrogen  This helps mood along with sertraline   Colon cancer screening  Has declined colonoscopy in the past  Interested in cologuard  Or virtual colonoscopy    BP Readings from Last 3 Encounters:  08/04/20 116/74  12/11/19 132/72  09/23/19 (!) 121/93   Pulse Readings from Last 3 Encounters:  08/04/20 75  12/11/19 65  09/23/19 73    Hyperlipidemia Lab Results  Component Value Date   CHOL 217 (H) 07/31/2020   CHOL 221 (H) 07/26/2019   CHOL 206 (H) 07/20/2018   Lab  Results  Component Value Date   HDL 57.30 07/31/2020   HDL 60.20 07/26/2019   HDL 56.50 07/20/2018   Lab Results  Component Value Date   LDLCALC 134 (H) 07/31/2020   LDLCALC 133 (H) 07/26/2019   LDLCALC 121 (H) 07/20/2018   Lab Results  Component Value Date   TRIG 130.0 07/31/2020   TRIG 139.0 07/26/2019   TRIG 144.0 07/20/2018   Lab Results  Component Value Date   CHOLHDL 4 07/31/2020   CHOLHDL 4 07/26/2019   CHOLHDL 4 07/20/2018   Lab Results  Component Value Date   LDLDIRECT 114.0 01/15/2013   LDLDIRECT 121.4 10/25/2007   LDLDIRECT 121.4 10/23/2007  she stays away from fatty foods for the most part   Asthma   Mood/depression  Take sertraline  ambien for sleep   Patient Active Problem List   Diagnosis Date Noted  . Trochanteric bursitis 12/11/2019  . Left groin pain 08/20/2018  . Left leg pain 07/26/2018  . Occipital neuralgia of right side 02/07/2017  . Colon cancer screening 01/22/2013  . Routine general medical examination at a health care facility 11/11/2012  . Other screening mammogram 07/23/2012  . Postmenopausal HRT (hormone replacement therapy) 07/07/2011  . DUPUYTREN'S CONTRACTURE 03/16/2010  . MENOPAUSE, SURGICAL 09/04/2009  . Asthma 08/11/2008  . HYPERLIPIDEMIA 12/04/2007  .  Depression 12/04/2007  . ALLERGIC RHINITIS 12/04/2007  . BLADDER PROLAPSE 12/04/2007  . Obesity 10/22/2007   Past Medical History:  Diagnosis Date  . Allergic rhinitis   . Asthma    mild  . Contracture of palmar fascia   . Depression   . Headache   . Mixed hyperlipidemia   . Obesity, unspecified   . Plantar fasciitis   . Symptomatic states associated with artificial menopause   . Unspecified disorder of bladder    Past Surgical History:  Procedure Laterality Date  . ANKLE FRACTURE SURGERY Right   . VAGINAL HYSTERECTOMY  2008   Enterocele repair   Social History   Tobacco Use  . Smoking status: Never Smoker  . Smokeless tobacco: Never Used  Vaping Use    . Vaping Use: Never used  Substance Use Topics  . Alcohol use: No    Alcohol/week: 0.0 standard drinks  . Drug use: No   Family History  Problem Relation Age of Onset  . Peripheral vascular disease Father        Smoker  . Prostate cancer Father   . Heart failure Father   . Other Father        Heart "problems"  . Stroke Mother   . Headache Neg Hx   . Breast cancer Neg Hx    No Known Allergies Current Outpatient Medications on File Prior to Visit  Medication Sig Dispense Refill  . albuterol (PROVENTIL HFA;VENTOLIN HFA) 108 (90 Base) MCG/ACT inhaler Inhale 2 puffs into the lungs every 4 (four) hours as needed for wheezing or shortness of breath (or cough). 8 g 11  . estradiol (ESTRACE) 0.5 MG tablet TAKE 1 TABLET(0.5 MG) BY MOUTH DAILY 30 tablet 0  . methocarbamol (ROBAXIN) 500 MG tablet Take 1 tablet (500 mg total) by mouth every 8 (eight) hours as needed for muscle spasms. 90 tablet 0  . Multiple Vitamin (MULTIVITAMIN) tablet Take 1 tablet by mouth daily.      Marland Kitchen zolpidem (AMBIEN) 10 MG tablet Take 1 tablet (10 mg total) by mouth at bedtime as needed for sleep (during travel). 15 tablet 0   No current facility-administered medications on file prior to visit.     Review of Systems  Constitutional: Negative for activity change, appetite change, fatigue, fever and unexpected weight change.  HENT: Negative for congestion, ear pain, rhinorrhea, sinus pressure and sore throat.   Eyes: Negative for pain, redness and visual disturbance.  Respiratory: Negative for cough, shortness of breath and wheezing.   Cardiovascular: Negative for chest pain and palpitations.  Gastrointestinal: Negative for abdominal pain, blood in stool, constipation and diarrhea.  Endocrine: Negative for polydipsia and polyuria.  Genitourinary: Negative for dysuria, frequency and urgency.  Musculoskeletal: Negative for arthralgias, back pain and myalgias.  Skin: Negative for pallor and rash.   Allergic/Immunologic: Negative for environmental allergies.  Neurological: Negative for dizziness, syncope and headaches.  Hematological: Negative for adenopathy. Does not bruise/bleed easily.  Psychiatric/Behavioral: Positive for dysphoric mood. Negative for decreased concentration. The patient is nervous/anxious.        Objective:   Physical Exam Constitutional:      General: She is not in acute distress.    Appearance: Normal appearance. She is well-developed. She is obese. She is not ill-appearing or diaphoretic.  HENT:     Head: Normocephalic and atraumatic.     Right Ear: Tympanic membrane, ear canal and external ear normal.     Left Ear: Tympanic membrane, ear canal and external ear  normal.     Nose: Nose normal. No congestion.     Mouth/Throat:     Mouth: Mucous membranes are moist.     Pharynx: Oropharynx is clear. No posterior oropharyngeal erythema.  Eyes:     General: No scleral icterus.    Extraocular Movements: Extraocular movements intact.     Conjunctiva/sclera: Conjunctivae normal.     Pupils: Pupils are equal, round, and reactive to light.  Neck:     Thyroid: No thyromegaly.     Vascular: No carotid bruit or JVD.  Cardiovascular:     Rate and Rhythm: Normal rate and regular rhythm.     Pulses: Normal pulses.     Heart sounds: Normal heart sounds. No gallop.   Pulmonary:     Effort: Pulmonary effort is normal. No respiratory distress.     Breath sounds: Normal breath sounds. No wheezing.     Comments: Good air exch Chest:     Chest wall: No tenderness.  Abdominal:     General: Bowel sounds are normal. There is no distension or abdominal bruit.     Palpations: Abdomen is soft. There is no mass.     Tenderness: There is no abdominal tenderness.     Hernia: No hernia is present.  Genitourinary:    Comments: Breast exam: No mass, nodules, thickening, tenderness, bulging, retraction, inflamation, nipple discharge or skin changes noted.  No axillary or  clavicular LA.     Musculoskeletal:        General: No tenderness. Normal range of motion.     Cervical back: Normal range of motion and neck supple. No rigidity. No muscular tenderness.     Right lower leg: No edema.     Left lower leg: No edema.  Lymphadenopathy:     Cervical: No cervical adenopathy.  Skin:    General: Skin is warm and dry.     Coloration: Skin is not pale.     Findings: No erythema or rash.     Comments: Solar lentigines diffusely   Neurological:     Mental Status: She is alert. Mental status is at baseline.     Cranial Nerves: No cranial nerve deficit.     Motor: No abnormal muscle tone.     Coordination: Coordination normal.     Gait: Gait normal.     Deep Tendon Reflexes: Reflexes are normal and symmetric. Reflexes normal.  Psychiatric:        Attention and Perception: Attention normal.        Mood and Affect: Mood normal.        Thought Content: Thought content normal.        Cognition and Memory: Cognition and memory normal.     Comments: Pt talked candidly about stressors and mood            Assessment & Plan:   Problem List Items Addressed This Visit      Respiratory   Asthma    symbicort keeps her in good control       Relevant Medications   budesonide-formoterol (SYMBICORT) 80-4.5 MCG/ACT inhaler     Other   HYPERLIPIDEMIA    Disc goals for lipids and reasons to control them Rev last labs with pt Rev low sat fat diet in detail  Overall a healthy eater  May consider statin if this goes up in the future        Obesity    Discussed how this problem influences overall health and the risks  it imposes  Reviewed plan for weight loss with lower calorie diet (via better food choices and also portion control or program like weight watchers) and exercise building up to or more than 30 minutes 5 days per week including some aerobic activity         Depression    Worse lately with stressors (losses, husband taking a break after loss of  his mother)  Reviewed stressors/ coping techniques/symptoms/ support sources/ tx options and side effects in detail today  Will add trazadone to help with mood and sleep  Disc poss side eff-she will update  Can titrate dose if needed  Continues sertraline Enc exercise and good self care      Relevant Medications   traZODone (DESYREL) 50 MG tablet   sertraline (ZOLOFT) 100 MG tablet   Routine general medical examination at a health care facility - Primary    Reviewed health habits including diet and exercise and skin cancer prevention Reviewed appropriate screening tests for age  Also reviewed health mt list, fam hx and immunization status , as well as social and family history   See HPI Labs reviewed  Commended wt loss  Declines colonoscopy- will check on cologuard cost and let us know  Disc tx for depression       Colon cancer screening    Pt declines colonoscopy  Considered virtual colonoscopy for 900 $ out of pocket  Will check into cost of cologuard and let us know which she prefers Disc pros/cons

## 2020-08-07 ENCOUNTER — Other Ambulatory Visit: Payer: Self-pay

## 2020-08-07 ENCOUNTER — Other Ambulatory Visit: Payer: BC Managed Care – PPO

## 2020-08-07 DIAGNOSIS — Z20822 Contact with and (suspected) exposure to covid-19: Secondary | ICD-10-CM

## 2020-08-08 LAB — SARS-COV-2, NAA 2 DAY TAT

## 2020-08-08 LAB — NOVEL CORONAVIRUS, NAA: SARS-CoV-2, NAA: NOT DETECTED

## 2020-08-10 ENCOUNTER — Encounter: Payer: Self-pay | Admitting: Family Medicine

## 2020-08-10 MED ORDER — ZOLPIDEM TARTRATE 10 MG PO TABS: 10.0000 mg | ORAL_TABLET | Freq: Every evening | ORAL | 0 refills | Status: DC | PRN

## 2020-08-13 ENCOUNTER — Other Ambulatory Visit: Payer: Self-pay | Admitting: Family Medicine

## 2020-08-22 ENCOUNTER — Other Ambulatory Visit: Payer: Self-pay | Admitting: Family Medicine

## 2020-09-09 DIAGNOSIS — H524 Presbyopia: Secondary | ICD-10-CM | POA: Diagnosis not present

## 2020-12-29 ENCOUNTER — Encounter: Payer: Self-pay | Admitting: Family Medicine

## 2020-12-30 MED ORDER — ZOLPIDEM TARTRATE 10 MG PO TABS: 10.0000 mg | ORAL_TABLET | Freq: Every evening | ORAL | 0 refills | Status: DC | PRN

## 2020-12-30 NOTE — Telephone Encounter (Signed)
Name of Medication: Ambien Name of Pharmacy: [facility] [address] Last [place] or Written Date and Quantity: 08/10/20 #15 tabs with 0 refills Last Office Visit and Type: CPE 08/04/20 Next Office Visit and Type: CPE 08/05/21

## 2021-01-30 ENCOUNTER — Encounter: Payer: Self-pay | Admitting: Family Medicine

## 2021-02-01 MED ORDER — ESTRADIOL 0.5 MG PO TABS
ORAL_TABLET | ORAL | 11 refills | Status: DC
Start: 2021-02-01 — End: 2022-01-24

## 2021-02-01 NOTE — Telephone Encounter (Signed)
Estrace refill

## 2021-02-08 ENCOUNTER — Other Ambulatory Visit: Payer: Self-pay

## 2021-02-08 ENCOUNTER — Encounter: Payer: Self-pay | Admitting: Emergency Medicine

## 2021-02-08 ENCOUNTER — Ambulatory Visit
Admission: EM | Admit: 2021-02-08 | Discharge: 2021-02-08 | Disposition: A | Discharge status: Home / Self Care | Payer: Federal, State, Local not specified - PPO | Attending: Sports Medicine | Admitting: Sports Medicine

## 2021-02-08 DIAGNOSIS — R5383 Other fatigue: Secondary | ICD-10-CM | POA: Diagnosis not present

## 2021-02-08 DIAGNOSIS — Z20822 Contact with and (suspected) exposure to covid-19: Secondary | ICD-10-CM | POA: Diagnosis not present

## 2021-02-08 DIAGNOSIS — R509 Fever, unspecified: Secondary | ICD-10-CM | POA: Insufficient documentation

## 2021-02-08 DIAGNOSIS — R0981 Nasal congestion: Secondary | ICD-10-CM | POA: Diagnosis not present

## 2021-02-08 DIAGNOSIS — Z7951 Long term (current) use of inhaled steroids: Secondary | ICD-10-CM | POA: Insufficient documentation

## 2021-02-08 DIAGNOSIS — J45909 Unspecified asthma, uncomplicated: Secondary | ICD-10-CM | POA: Diagnosis not present

## 2021-02-08 DIAGNOSIS — J020 Streptococcal pharyngitis: Secondary | ICD-10-CM | POA: Diagnosis not present

## 2021-02-08 DIAGNOSIS — R059 Cough, unspecified: Secondary | ICD-10-CM | POA: Insufficient documentation

## 2021-02-08 LAB — GROUP A STREP BY PCR: Group A Strep by PCR: DETECTED — AB

## 2021-02-08 MED ORDER — AMOXICILLIN 500 MG PO CAPS: 500.0000 mg | ORAL_CAPSULE | Freq: Two times a day (BID) | ORAL | 0 refills | Status: AC

## 2021-02-08 NOTE — ED Provider Notes (Signed)
[facility]    CSN: [id number] Arrival date & time: 02/08/21  1146      History   Chief Complaint Chief Complaint  Patient presents with  . Sore Throat    HPI Ariel Compton is a 65 y.o. female presenting for approximately 10-day history of sore throat and painful swallowing.  She says that her sore throat is worse over the past couple of days and she has had a fever up to 101 degrees.  Patient also admits to nasal congestion/runny nose and a mild cough as well as fatigue, but states that she has seasonal allergies and asthma and is not uncommon for her to have some of those symptoms with allergy flareups.  Patient's been taking over-the-counter Tylenol and allergy medication.  Patient has had negative at home COVID-19 test.  She does that she has been exposed to strep throat through a friend.  Patient concerned about possible strep throat given her exposure.  No known exposure to COVID-19 and patient fully vaccinated.  Patient denies any weakness, chest pain, breathing difficulty, nausea/vomiting or diarrhea.  No other complaints or concerns.  HPI  Past Medical History:  Diagnosis Date  . Allergic rhinitis   . Asthma    mild  . Contracture of palmar fascia   . Depression   . Headache   . Mixed hyperlipidemia   . Obesity, unspecified   . Plantar fasciitis   . Symptomatic states associated with artificial menopause   . Unspecified disorder of bladder     Patient Active Problem List   Diagnosis Date Noted  . Trochanteric bursitis 12/11/2019  . Occipital neuralgia 02/07/2017  . Colon cancer screening 01/22/2013  . Routine general medical examination at a health care facility 11/11/2012  . Other screening mammogram 07/23/2012  . Postmenopausal HRT (hormone replacement therapy) 07/07/2011  . DUPUYTREN'S CONTRACTURE 03/16/2010  . MENOPAUSE, SURGICAL 09/04/2009  . Asthma 08/11/2008  . HYPERLIPIDEMIA 12/04/2007  . Depression 12/04/2007  . ALLERGIC RHINITIS  12/04/2007  . BLADDER PROLAPSE 12/04/2007  . Obesity 10/22/2007    Past Surgical History:  Procedure Laterality Date  . ANKLE FRACTURE SURGERY Right   . VAGINAL HYSTERECTOMY  2008   Enterocele repair    OB History   No obstetric history on file.      Home Medications    Prior to Admission medications   Medication Sig Start Date End Date Taking? Authorizing Provider  albuterol (PROVENTIL HFA;VENTOLIN HFA) 108 (90 Base) MCG/ACT inhaler Inhale 2 puffs into the lungs every 4 (four) hours as needed for wheezing or shortness of breath (or cough). 07/21/17  Yes [name], Audrie Gallus, MD  amoxicillin (AMOXIL) 500 MG capsule Take 1 capsule (500 mg total) by mouth 2 (two) times daily for 10 days. 02/08/21 02/18/21 Yes Shirlee Latch, PA-C  budesonide-formoterol (SYMBICORT) 80-4.5 MCG/ACT inhaler Inhale 2 puffs into the lungs 2 (two) times daily. 08/04/20  Yes [name], Audrie Gallus, MD  estradiol (ESTRACE) 0.5 MG tablet TAKE 1 TABLET(0.5 MG) BY MOUTH DAILY 02/01/21  Yes [name], Audrie Gallus, MD  gabapentin (NEURONTIN) 600 MG tablet Take 1 tablet (600 mg total) by mouth 3 (three) times daily. 08/04/20  Yes [name], Audrie Gallus, MD  methocarbamol (ROBAXIN) 500 MG tablet Take 1 tablet (500 mg total) by mouth every 8 (eight) hours as needed for muscle spasms. 04/13/20  Yes [name], Audrie Gallus, MD  Multiple Vitamin (MULTIVITAMIN) tablet Take 1 tablet by mouth daily.   Yes [provider]  sertraline (ZOLOFT) 100 MG  tablet Take 1.5 tablets (150 mg total) by mouth daily. 08/04/20  Yes [name], Audrie Gallus, MD  zolpidem (AMBIEN) 10 MG tablet Take 1 tablet (10 mg total) by mouth at bedtime as needed for sleep (during travel). 12/30/20 01/29/21 Yes [name], Audrie Gallus, MD    Family History Family History  Problem Relation Age of Onset  . Peripheral vascular disease Father        Smoker  . Prostate cancer Father   . Heart failure Father   . Other Father        Heart "problems"  . Stroke Mother   . Headache Neg Hx   . Breast cancer Neg  Hx     Social History Social History   Tobacco Use  . Smoking status: Never Smoker  . Smokeless tobacco: Never Used  Vaping Use  . Vaping Use: Never used  Substance Use Topics  . Alcohol use: No    Alcohol/week: 0.0 standard drinks  . Drug use: No     Allergies   Patient has no known allergies.   Review of Systems Review of Systems  Constitutional: Positive for fatigue. Negative for chills, diaphoresis and fever.  HENT: Positive for congestion, rhinorrhea and sore throat. Negative for ear pain, sinus pressure and sinus pain.   Respiratory: Positive for cough. Negative for shortness of breath.   Gastrointestinal: Negative for abdominal pain, nausea and vomiting.  Musculoskeletal: Negative for arthralgias and myalgias.  Skin: Negative for rash.  Neurological: Negative for weakness and headaches.  Hematological: Positive for adenopathy.     Physical Exam Triage Vital Signs ED Triage Vitals  Enc Vitals Group     BP 02/08/21 1211 120/83     Pulse Rate 02/08/21 1211 77     Resp 02/08/21 1211 18     Temp 02/08/21 1211 98.7 F (37.1 C)     Temp Source 02/08/21 1211 Oral     SpO2 02/08/21 1211 97 %     Weight 02/08/21 1208 182 lb 1.6 oz (82.6 kg)     Height 02/08/21 1208 5' 2.75" (1.594 m)     Head Circumference --      Peak Flow --      Pain Score 02/08/21 1208 7     Pain Loc --      Pain Edu? --      Excl. in GC? --    No data found.  Updated Vital Signs BP 120/83 (BP Location: Right Arm)   Pulse 77   Temp 98.7 F (37.1 C) (Oral)   Resp 18   Ht 5' 2.75" (1.594 m)   Wt 182 lb 1.6 oz (82.6 kg)   SpO2 97%   BMI 32.52 kg/m       Physical Exam Vitals and nursing note reviewed.  Constitutional:      General: She is not in acute distress.    Appearance: Normal appearance. She is not ill-appearing or toxic-appearing.  HENT:     Head: Normocephalic and atraumatic.     Nose: Rhinorrhea (trace clear drainage) present.     Mouth/Throat:     Mouth: Mucous  membranes are moist.     Pharynx: Oropharynx is clear.     Tonsils: No tonsillar exudate. 2+ on the right. 2+ on the left.  Eyes:     General: No scleral icterus.       Right eye: No discharge.        Left eye: No discharge.     Conjunctiva/sclera: Conjunctivae normal.  Cardiovascular:     Rate and Rhythm: Normal rate and regular rhythm.     Heart sounds: Normal heart sounds.  Pulmonary:     Effort: Pulmonary effort is normal. No respiratory distress.     Breath sounds: Normal breath sounds.  Musculoskeletal:     Cervical back: Neck supple.  Lymphadenopathy:     Cervical: Cervical adenopathy present.  Skin:    General: Skin is dry.  Neurological:     General: No focal deficit present.     Mental Status: She is alert. Mental status is at baseline.     Motor: No weakness.     Gait: Gait normal.  Psychiatric:        Mood and Affect: Mood normal.        Behavior: Behavior normal.        Thought Content: Thought content normal.      UC Treatments / Results  Labs (all labs ordered are listed, but only abnormal results are displayed) Labs Reviewed  GROUP A STREP BY PCR - Abnormal; Notable for the following components:      Result Value   Group A Strep by PCR DETECTED (*)    All other components within normal limits  SARS CORONAVIRUS 2 (TAT 6-24 HRS)    EKG   Radiology No results found.  Procedures Procedures (including critical care time)  Medications Ordered in UC Medications - No data to display  Initial Impression / Assessment and Plan / UC Course  I have reviewed the triage vital signs and the nursing notes.  Pertinent labs & imaging results that were available during my care of the patient were reviewed by me and considered in my medical decision making (see chart for details).   65 year old female presenting for sore throat x10 days.  Positive strep throat exposure and patient concerned about strep throat.  All vital signs are normal and stable in the  clinic.  She does have 2+ bilateral tonsillar enlargement as well as erythema and cervical lymphadenopathy.  Minor clear nasal drainage.  Molecular strep test positive today.  Covid test performed given other symptoms of cough and congestion.  Current CDC guidelines, isolation protocol and ED precautions reviewed patient.  Advised that this is most likely her allergies and to continue taking OTC allergy medications.  Sent amoxicillin for strep throat.  Encourage increasing rest and fluids.  Follow-up with our department as needed.  ED precautions for sore throat reviewed.   Final Clinical Impressions(s) / UC Diagnoses   Final diagnoses:  Strep throat  Fever, unspecified  Nasal congestion   Discharge Instructions   None    ED Prescriptions    Medication Sig Dispense Auth. Provider   amoxicillin (AMOXIL) 500 MG capsule Take 1 capsule (500 mg total) by mouth 2 (two) times daily for 10 days. 20 capsule Shirlee Latch, PA-C     PDMP not reviewed this encounter.   Shirlee Latch, PA-C 02/08/21 1259

## 2021-02-08 NOTE — ED Triage Notes (Signed)
Pt c/o sore throat, nasal congestion, runny nose and fatigue. Started about 10 days ago. She states she took 2 home Covid test and were negative. She state she has been around someone that has had strep.

## 2021-02-09 LAB — SARS CORONAVIRUS 2 (TAT 6-24 HRS): SARS Coronavirus 2: NEGATIVE

## 2021-02-27 ENCOUNTER — Other Ambulatory Visit: Payer: Self-pay | Admitting: Family Medicine

## 2021-04-26 ENCOUNTER — Encounter: Payer: Self-pay | Admitting: Family Medicine

## 2021-04-27 MED ORDER — ZOLPIDEM TARTRATE 10 MG PO TABS: 10.0000 mg | ORAL_TABLET | Freq: Every evening | ORAL | 0 refills | Status: DC | PRN

## 2021-04-27 NOTE — Telephone Encounter (Signed)
Name of Medication: Ambien Name of Pharmacy: [facility] [address] Last [place] or Written Date and Quantity: 12/30/20 #15 tabs with 0 refills Last Office Visit and Type: CPE 08/04/20 Next Office Visit and Type: CPE 08/05/21

## 2021-05-17 ENCOUNTER — Encounter: Payer: Self-pay | Admitting: Emergency Medicine

## 2021-05-17 ENCOUNTER — Other Ambulatory Visit: Payer: Self-pay

## 2021-05-17 ENCOUNTER — Ambulatory Visit
Admission: EM | Admit: 2021-05-17 | Discharge: 2021-05-17 | Disposition: A | Discharge status: Home / Self Care | Payer: Federal, State, Local not specified - PPO | Attending: Internal Medicine | Admitting: Internal Medicine

## 2021-05-17 DIAGNOSIS — R519 Headache, unspecified: Secondary | ICD-10-CM | POA: Insufficient documentation

## 2021-05-17 DIAGNOSIS — N3001 Acute cystitis with hematuria: Secondary | ICD-10-CM | POA: Insufficient documentation

## 2021-05-17 DIAGNOSIS — R42 Dizziness and giddiness: Secondary | ICD-10-CM | POA: Insufficient documentation

## 2021-05-17 DIAGNOSIS — R109 Unspecified abdominal pain: Secondary | ICD-10-CM | POA: Diagnosis not present

## 2021-05-17 DIAGNOSIS — R5383 Other fatigue: Secondary | ICD-10-CM | POA: Diagnosis not present

## 2021-05-17 LAB — CBC WITH DIFFERENTIAL/PLATELET
Abs Immature Granulocytes: 0.02 10*3/uL (ref 0.00–0.07)
Basophils Absolute: 0.1 10*3/uL (ref 0.0–0.1)
Basophils Relative: 1 %
Eosinophils Absolute: 0 10*3/uL (ref 0.0–0.5)
Eosinophils Relative: 0 %
HCT: 40.4 % (ref 36.0–46.0)
Hemoglobin: 13.2 g/dL (ref 12.0–15.0)
Immature Granulocytes: 0 %
Lymphocytes Relative: 16 %
Lymphs Abs: 1.2 10*3/uL (ref 0.7–4.0)
MCH: 28.9 pg (ref 26.0–34.0)
MCHC: 32.7 g/dL (ref 30.0–36.0)
MCV: 88.6 fL (ref 80.0–100.0)
Monocytes Absolute: 0.4 10*3/uL (ref 0.1–1.0)
Monocytes Relative: 5 %
Neutro Abs: 6 10*3/uL (ref 1.7–7.7)
Neutrophils Relative %: 78 %
Platelets: 215 10*3/uL (ref 150–400)
RBC: 4.56 MIL/uL (ref 3.87–5.11)
RDW: 13.4 % (ref 11.5–15.5)
WBC: 7.6 10*3/uL (ref 4.0–10.5)
nRBC: 0 % (ref 0.0–0.2)

## 2021-05-17 LAB — URINALYSIS, COMPLETE (UACMP) WITH MICROSCOPIC
Bilirubin Urine: NEGATIVE
Glucose, UA: NEGATIVE mg/dL
Ketones, ur: NEGATIVE mg/dL
Leukocytes,Ua: NEGATIVE
Nitrite: NEGATIVE
Specific Gravity, Urine: 1.025 (ref 1.005–1.030)
pH: 5.5 (ref 5.0–8.0)

## 2021-05-17 LAB — COMPREHENSIVE METABOLIC PANEL
ALT: 13 U/L (ref 0–44)
AST: 19 U/L (ref 15–41)
Albumin: 3.9 g/dL (ref 3.5–5.0)
Alkaline Phosphatase: 50 U/L (ref 38–126)
Anion gap: 9 (ref 5–15)
BUN: 10 mg/dL (ref 8–23)
CO2: 26 mmol/L (ref 22–32)
Calcium: 8.6 mg/dL — ABNORMAL LOW (ref 8.9–10.3)
Chloride: 102 mmol/L (ref 98–111)
Creatinine, Ser: 0.78 mg/dL (ref 0.44–1.00)
GFR, Estimated: >60 mL/min (ref >60)
Glucose, Bld: 113 mg/dL — ABNORMAL HIGH (ref 70–99)
Potassium: 3.8 mmol/L (ref 3.5–5.1)
Sodium: 137 mmol/L (ref 135–145)
Total Bilirubin: 0.3 mg/dL (ref 0.3–1.2)
Total Protein: 7.6 g/dL (ref 6.5–8.1)

## 2021-05-17 MED ORDER — LEVOFLOXACIN 500 MG PO TABS: 500.0000 mg | ORAL_TABLET | Freq: Every day | ORAL | 0 refills | Status: DC

## 2021-05-17 NOTE — ED Triage Notes (Signed)
Patient c/o the worst headache she has had x 1 week. She states her balance has been affected and she has been running into walls. She states she thinks she has a sinus infection that she believes is causing her symptoms. She has taken 3 home COVID tests and all have been negative.

## 2021-05-17 NOTE — Discharge Instructions (Addendum)
Use your Nasocort daily for 7 days to help fluid in the ear.  Follow up with your PCP ASAP to get head CT ordered since you dont want to go to ER today.

## 2021-05-17 NOTE — ED Provider Notes (Signed)
[facility]    CSN: [id number] Arrival date & time: 05/17/21  0846      History   Chief Complaint Chief Complaint  Patient presents with  . Headache  . impaired gait  . Nasal Congestion    HPI Ariel Compton is a 65 y.o. female who present with worst HA she has ever had located on her forehead to her temples. Has been nauseos and photophobia. Pain is constant. Has been taking Tylenol, Ibuprofe, and or ASA.  She has also been having rigors. She got up last night and ran into the wall and sat on the floor for a few minutes and when got up was a little better.  Had 3 home covid test and the last one was 2 day ago. Has been coughing, and pressure on face. Has hx of Occipital neuralgia but this feels different.  She has been cold at night and initially had sweating at night. Now she is just cold at night and screams (per her husband) in her dreams which is abnormal for her.     Past Medical History:  Diagnosis Date  . Allergic rhinitis   . Asthma    mild  . Contracture of palmar fascia   . Depression   . Headache   . Mixed hyperlipidemia   . Obesity, unspecified   . Plantar fasciitis   . Symptomatic states associated with artificial menopause   . Unspecified disorder of bladder     Patient Active Problem List   Diagnosis Date Noted  . Trochanteric bursitis 12/11/2019  . Occipital neuralgia 02/07/2017  . Colon cancer screening 01/22/2013  . Routine general medical examination at a health care facility 11/11/2012  . Other screening mammogram 07/23/2012  . Postmenopausal HRT (hormone replacement therapy) 07/07/2011  . DUPUYTREN'S CONTRACTURE 03/16/2010  . MENOPAUSE, SURGICAL 09/04/2009  . Asthma 08/11/2008  . HYPERLIPIDEMIA 12/04/2007  . Depression 12/04/2007  . ALLERGIC RHINITIS 12/04/2007  . BLADDER PROLAPSE 12/04/2007  . Obesity 10/22/2007    Past Surgical History:  Procedure Laterality Date  . ANKLE FRACTURE SURGERY Right   . VAGINAL HYSTERECTOMY   2008   Enterocele repair    OB History   No obstetric history on file.      Home Medications    Prior to Admission medications   Medication Sig Start Date End Date Taking? Authorizing Provider  albuterol (PROVENTIL HFA;VENTOLIN HFA) 108 (90 Base) MCG/ACT inhaler Inhale 2 puffs into the lungs every 4 (four) hours as needed for wheezing or shortness of breath (or cough). 07/21/17  Yes [name], Audrie Gallus, MD  budesonide-formoterol (SYMBICORT) 80-4.5 MCG/ACT inhaler Inhale 2 puffs into the lungs 2 (two) times daily. 08/04/20  Yes [name], Audrie Gallus, MD  buPROPion (WELLBUTRIN) 75 MG tablet Take 75 mg by mouth 2 (two) times daily.   Yes [provider]  citalopram (CELEXA) 20 MG tablet Take 20 mg by mouth daily.   Yes [provider]  estradiol (ESTRACE) 0.5 MG tablet TAKE 1 TABLET(0.5 MG) BY MOUTH DAILY 02/01/21  Yes [name], Audrie Gallus, MD  gabapentin (NEURONTIN) 600 MG tablet Take 1 tablet (600 mg total) by mouth 3 (three) times daily. 08/04/20  Yes [name], Audrie Gallus, MD  levofloxacin (LEVAQUIN) 500 MG tablet Take 1 tablet (500 mg total) by mouth daily. 05/17/21  Yes Rodriguez-Southworth, Nettie Elm, PA-C  methocarbamol (ROBAXIN) 500 MG tablet Take 1 tablet (500 mg total) by mouth every 8 (eight) hours as needed for muscle spasms. 04/13/20  Yes [name], [employer]	[place],  MD  Multiple Vitamin (MULTIVITAMIN) tablet Take 1 tablet by mouth daily.   Yes [provider]  zolpidem (AMBIEN) 10 MG tablet Take 1 tablet (10 mg total) by mouth at bedtime as needed for sleep (during travel). 04/27/21 05/27/21 Yes [name], Audrie Gallus, MD  sertraline (ZOLOFT) 100 MG tablet Take 1.5 tablets (150 mg total) by mouth daily. 08/04/20 05/17/21  [name], Audrie Gallus, MD    Family History Family History  Problem Relation Age of Onset  . Peripheral vascular disease Father        Smoker  . Prostate cancer Father   . Heart failure Father   . Other Father        Heart "problems"  . Stroke Mother   . Headache Neg Hx   . Breast cancer  Neg Hx     Social History Social History   Tobacco Use  . Smoking status: Never Smoker  . Smokeless tobacco: Never Used  Vaping Use  . Vaping Use: Never used  Substance Use Topics  . Alcohol use: No    Alcohol/week: 0.0 standard drinks  . Drug use: No     Allergies   Patient has no known allergies.   Review of Systems Review of Systems  Constitutional: Positive for appetite change, chills, fatigue and fever. Negative for diaphoresis.  HENT: Positive for sinus pain. Negative for congestion, ear discharge, ear pain, mouth sores, postnasal drip, rhinorrhea, sore throat, tinnitus and trouble swallowing.   Eyes: Negative for visual disturbance.  Respiratory: Positive for cough. Negative for chest tightness.   Cardiovascular: Negative for chest pain.  Gastrointestinal: Positive for diarrhea and nausea. Negative for abdominal pain and vomiting.  Endocrine: Positive for polydipsia.  Genitourinary: Positive for frequency. Negative for dysuria.  Musculoskeletal: Positive for back pain. Negative for myalgias.       Bilateral over her hips x 1 week   Skin: Negative for rash.  Neurological: Positive for dizziness and headaches. Negative for weakness.  Hematological: Negative for adenopathy.     Physical Exam Triage Vital Signs ED Triage Vitals  Enc Vitals Group     BP 05/17/21 0905 (!) 108/56     Pulse Rate 05/17/21 0905 88     Resp 05/17/21 0905 18     Temp 05/17/21 0905 99 F (37.2 C)     Temp Source 05/17/21 0905 Oral     SpO2 05/17/21 0905 98 %     Weight 05/17/21 0902 182 lb 1.6 oz (82.6 kg)     Height 05/17/21 0902 5\' 2"  (1.575 m)     Head Circumference --      Peak Flow --      Pain Score 05/17/21 0902 5     Pain Loc --      Pain Edu? --      Excl. in GC? --    No data found.  Updated Vital Signs BP (!) 108/56 (BP Location: Left Arm)   Pulse 88   Temp 99 F (37.2 C) (Oral)   Resp 18   Ht 5\' 2"  (1.575 m)   Wt 182 lb 1.6 oz (82.6 kg)   SpO2 98%   BMI  33.31 kg/m   Visual Acuity Right Eye Distance:   Left Eye Distance:   Bilateral Distance:    Right Eye Near:   Left Eye Near:    Bilateral Near:     Physical Exam  Physical Exam Vitals signs and nursing note reviewed.  Constitutional:      General:  He is not in acute distress.    Appearance: He is well-developed and normal weight. He is not ill-appearing, toxic-appearing or diaphoretic.  HENT:     Head: Normocephalic. Temporal pulses are normal and non tender EARS- normal external ears, TM's are gray, canals are normal NOSE- normal mucosa, has tender maxillary sinuses PHARYNX- clear Eyes:     Extraocular Movements: Extraocular movements intact.     Pupils: Pupils are equal, round, and reactive to light.  Neck:     Musculoskeletal: Neck supple. No neck rigidity.     Meningeal: Brudzinski's sign absent.  Cardiovascular:     Rate and Rhythm: Normal rate and regular rhythm.     Heart sounds: No murmur.  Pulmonary:     Effort: Pulmonary effort is normal.     Breath sounds: Normal breath sounds. No wheezing, rhonchi or rales.  Abdominal:     General: Bowel sounds are normal.     Palpations: Abdomen is soft. There is no mass.     Tenderness: There is no abdominal tenderness. There is no guarding.  Musculoskeletal: Normal range of motion. Neg CVA tenderness Lymphadenopathy:     Cervical: No cervical adenopathy.  Skin:    General: Skin is warm and dry.  Neurological:     Mental Status: He is alert.     Cranial Nerves: No cranial nerve deficit or facial asymmetry.     Sensory: No sensory deficit.     Motor: No weakness.     Coordination: Romberg sign negative. Coordination normal.     Gait: Gait normal.     Deep Tendon Reflexes: Reflexes normal.     Comments: Normal Romberg, propioception, finger to nose. Tandem gait  Is unsteady    Psychiatric:        Mood and Affect: Mood normal.        Speech: Speech normal.        Behavior: Behavior normal.    UC Treatments /  Results  Labs (all labs ordered are listed, but only abnormal results are displayed) Labs Reviewed  COMPREHENSIVE METABOLIC PANEL - Abnormal; Notable for the following components:      Result Value   Glucose, Bld 113 (*)    Calcium 8.6 (*)    All other components within normal limits  URINALYSIS, COMPLETE (UACMP) WITH MICROSCOPIC - Abnormal; Notable for the following components:   Hgb urine dipstick MODERATE (*)    Protein, ur TRACE (*)    Bacteria, UA MANY (*)    All other components within normal limits  CBC WITH DIFFERENTIAL/PLATELET    EKG   Radiology No results found.  Procedures Procedures (including critical care time)  Medications Ordered in UC Medications - No data to display  Initial Impression / Assessment and Plan / UC Course  I have reviewed the triage vital signs and the nursing notes. Pertinent labs results that were available during my care of the patient were reviewed by me and considered in my medical decision making (see chart for details). She was told we do not have CT today to do this with her complaint of " worst HA", but she declined to go ER when I advised her so, saying she would rather see her PCP and have her order this. I explained to her that she is not tender on ethmoid or frontal sinuses to think is from her sinuses.  Her urine has moderate bacteria and may be getting a UTI. So I placed her on Levaquin to cover maxillary sinus  since they are tender, and UTI.  Final Clinical Impressions(s) / UC Diagnoses   Final diagnoses:  Worsening headaches  Right flank pain  Acute cystitis with hematuria  Dizziness and giddiness  Other fatigue     Discharge Instructions     Use your Nasocort daily for 7 days to help fluid in the ear.  Follow up with your PCP ASAP to get head CT ordered since you dont want to go to ER today.     ED Prescriptions    Medication Sig Dispense Auth. Provider   levofloxacin (LEVAQUIN) 500 MG tablet Take 1 tablet (500  mg total) by mouth daily. 7 tablet Rodriguez-Southworth, Nettie Elm, PA-C     PDMP not reviewed this encounter.   Garey Ham, PA-C 05/17/21 1233

## 2021-05-19 ENCOUNTER — Encounter: Payer: Self-pay | Admitting: Family Medicine

## 2021-05-30 ENCOUNTER — Other Ambulatory Visit: Payer: Self-pay | Admitting: Family Medicine

## 2021-05-31 ENCOUNTER — Other Ambulatory Visit: Payer: Self-pay | Admitting: Family Medicine

## 2021-05-31 DIAGNOSIS — Z1231 Encounter for screening mammogram for malignant neoplasm of breast: Secondary | ICD-10-CM

## 2021-07-28 ENCOUNTER — Telehealth (INDEPENDENT_AMBULATORY_CARE_PROVIDER_SITE_OTHER): Payer: Federal, State, Local not specified - PPO | Admitting: Family Medicine

## 2021-07-28 DIAGNOSIS — Z0000 Encounter for general adult medical examination without abnormal findings: Secondary | ICD-10-CM | POA: Diagnosis not present

## 2021-07-28 DIAGNOSIS — E782 Mixed hyperlipidemia: Secondary | ICD-10-CM | POA: Diagnosis not present

## 2021-07-28 NOTE — Telephone Encounter (Signed)
-----   Message from Alvina Chou sent at 07/12/2021 10:36 AM EDT ----- Regarding: Lab orders for Thursday, 8.18.22 Patient is scheduled for CPX labs, please order future labs, Thanks , Camelia Eng

## 2021-07-29 ENCOUNTER — Other Ambulatory Visit: Payer: Self-pay

## 2021-07-29 ENCOUNTER — Ambulatory Visit
Admission: RE | Admit: 2021-07-29 | Discharge: 2021-07-29 | Disposition: A | Discharge status: Home / Self Care | Payer: Federal, State, Local not specified - PPO | Source: Ambulatory Visit | Attending: Family Medicine | Admitting: Family Medicine

## 2021-07-29 ENCOUNTER — Other Ambulatory Visit (INDEPENDENT_AMBULATORY_CARE_PROVIDER_SITE_OTHER): Payer: Federal, State, Local not specified - PPO

## 2021-07-29 DIAGNOSIS — Z1231 Encounter for screening mammogram for malignant neoplasm of breast: Secondary | ICD-10-CM | POA: Diagnosis not present

## 2021-07-29 DIAGNOSIS — Z0000 Encounter for general adult medical examination without abnormal findings: Secondary | ICD-10-CM | POA: Diagnosis not present

## 2021-07-29 DIAGNOSIS — E782 Mixed hyperlipidemia: Secondary | ICD-10-CM

## 2021-07-29 LAB — COMPREHENSIVE METABOLIC PANEL
ALT: 9 U/L (ref 0–35)
AST: 15 U/L (ref 0–37)
Albumin: 3.8 g/dL (ref 3.5–5.2)
Alkaline Phosphatase: 42 U/L (ref 39–117)
BUN: 14 mg/dL (ref 6–23)
CO2: 30 mEq/L (ref 19–32)
Calcium: 9 mg/dL (ref 8.4–10.5)
Chloride: 106 mEq/L (ref 96–112)
Creatinine, Ser: 0.83 mg/dL (ref 0.40–1.20)
GFR: 74.9 mL/min (ref >60.00)
Glucose, Bld: 91 mg/dL (ref 70–99)
Potassium: 4.7 mEq/L (ref 3.5–5.1)
Sodium: 142 mEq/L (ref 135–145)
Total Bilirubin: 0.4 mg/dL (ref 0.2–1.2)
Total Protein: 6.7 g/dL (ref 6.0–8.3)

## 2021-07-29 LAB — CBC WITH DIFFERENTIAL/PLATELET
Basophils Absolute: 0.1 10*3/uL (ref 0.0–0.1)
Basophils Relative: 1.2 % (ref 0.0–3.0)
Eosinophils Absolute: 0.3 10*3/uL (ref 0.0–0.7)
Eosinophils Relative: 4.5 % (ref 0.0–5.0)
HCT: 38.6 % (ref 36.0–46.0)
Hemoglobin: 12.8 g/dL (ref 12.0–15.0)
Lymphocytes Relative: 26.1 % (ref 12.0–46.0)
Lymphs Abs: 1.5 10*3/uL (ref 0.7–4.0)
MCHC: 33.2 g/dL (ref 30.0–36.0)
MCV: 88.4 fl (ref 78.0–100.0)
Monocytes Absolute: 0.3 10*3/uL (ref 0.1–1.0)
Monocytes Relative: 6.2 % (ref 3.0–12.0)
Neutro Abs: 3.5 10*3/uL (ref 1.4–7.7)
Neutrophils Relative %: 62 % (ref 43.0–77.0)
Platelets: 274 10*3/uL (ref 150.0–400.0)
RBC: 4.36 Mil/uL (ref 3.87–5.11)
RDW: 14.2 % (ref 11.5–15.5)
WBC: 5.7 10*3/uL (ref 4.0–10.5)

## 2021-07-29 LAB — LIPID PANEL
Cholesterol: 193 mg/dL (ref 0–200)
HDL: 68.3 mg/dL (ref >39.00)
LDL Cholesterol: 108 mg/dL — ABNORMAL HIGH (ref 0–99)
NonHDL: 124.91
Total CHOL/HDL Ratio: 3
Triglycerides: 84 mg/dL (ref 0.0–149.0)
VLDL: 16.8 mg/dL (ref 0.0–40.0)

## 2021-07-29 LAB — TSH: TSH: 2.56 u[IU]/mL (ref 0.35–5.50)

## 2021-08-05 ENCOUNTER — Other Ambulatory Visit: Payer: Self-pay

## 2021-08-05 ENCOUNTER — Encounter: Payer: Self-pay | Admitting: Family Medicine

## 2021-08-05 ENCOUNTER — Ambulatory Visit (INDEPENDENT_AMBULATORY_CARE_PROVIDER_SITE_OTHER): Payer: Federal, State, Local not specified - PPO | Admitting: Family Medicine

## 2021-08-05 VITALS — BP 126/88 | HR 89 | Temp 97.8°F | Ht 62.5 in | Wt 182.0 lb

## 2021-08-05 DIAGNOSIS — Z79890 Hormone replacement therapy (postmenopausal): Secondary | ICD-10-CM

## 2021-08-05 DIAGNOSIS — E782 Mixed hyperlipidemia: Secondary | ICD-10-CM

## 2021-08-05 DIAGNOSIS — Z1211 Encounter for screening for malignant neoplasm of colon: Secondary | ICD-10-CM

## 2021-08-05 DIAGNOSIS — Z6832 Body mass index (BMI) 32.0-32.9, adult: Secondary | ICD-10-CM

## 2021-08-05 DIAGNOSIS — Z0000 Encounter for general adult medical examination without abnormal findings: Secondary | ICD-10-CM

## 2021-08-05 DIAGNOSIS — F341 Dysthymic disorder: Secondary | ICD-10-CM

## 2021-08-05 DIAGNOSIS — M5481 Occipital neuralgia: Secondary | ICD-10-CM | POA: Diagnosis not present

## 2021-08-05 DIAGNOSIS — E6609 Other obesity due to excess calories: Secondary | ICD-10-CM

## 2021-08-05 DIAGNOSIS — J4520 Mild intermittent asthma, uncomplicated: Secondary | ICD-10-CM | POA: Diagnosis not present

## 2021-08-05 MED ORDER — GABAPENTIN 600 MG PO TABS: 600.0000 mg | ORAL_TABLET | Freq: Three times a day (TID) | ORAL | 3 refills | Status: DC

## 2021-08-05 MED ORDER — ALBUTEROL SULFATE HFA 108 (90 BASE) MCG/ACT IN AERS: 2.0000 | INHALATION_SPRAY | RESPIRATORY_TRACT | 11 refills | Status: DC | PRN

## 2021-08-05 MED ORDER — FLUTICASONE-SALMETEROL 100-50 MCG/ACT IN AEPB: 1.0000 | INHALATION_SPRAY | Freq: Two times a day (BID) | RESPIRATORY_TRACT | 3 refills | Status: DC

## 2021-08-05 NOTE — Assessment & Plan Note (Signed)
Discussed options Declines colonoscopy  cologuard ordered

## 2021-08-05 NOTE — Assessment & Plan Note (Signed)
Discussed how this problem influences overall health and the risks it imposes  Reviewed plan for weight loss with lower calorie diet (via better food choices and also portion control or program like weight watchers) and exercise building up to or more than 30 minutes 5 days per week including some aerobic activity    

## 2021-08-05 NOTE — Assessment & Plan Note (Signed)
Reviewed health habits including diet and exercise and skin cancer prevention Reviewed appropriate screening tests for age  Also reviewed health mt list, fam hx and immunization status , as well as social and family history   See HPI Labs ordered  Interested in shingrix -will check on coverage  Flu shot-recent  cologuard ordered  Mammogram utd  Discussed risks of HRT Enc vit D intake and exercise for bone health Mood is good/stable

## 2021-08-05 NOTE — Patient Instructions (Addendum)
If you are interested in the shingles vaccine series (Shingrix), call your insurance or pharmacy to check on coverage and location it must be given.  If affordable - you can schedule it here or at your pharmacy depending on coverage   I ordered cologuard   Take care of yourself  Stay active

## 2021-08-05 NOTE — Assessment & Plan Note (Signed)
Disc goals for lipids and reasons to control them Rev last labs with pt Rev low sat fat diet in detail  LDL is improved-enc to keep up better diet

## 2021-08-05 NOTE — Assessment & Plan Note (Signed)
Estrace 0.5 mg daily Taking for mood and vasomotor symptoms  Disc plan to wean-pt struggled in the past  Wants to get through year with loss of mother before trying  Rev risks of breast cancer and blood clots but benefit for bone health  May consider slow wean in a year

## 2021-08-05 NOTE — Assessment & Plan Note (Signed)
Fairly stable despite recent loss of her mother  Taking wellbutrin 75 mg bid and celexa 20 mg daily  Has psychiatrist and counseling on line Enc good self care Mood is positive today  Offered addn help wit grief if needed

## 2021-08-05 NOTE — Assessment & Plan Note (Signed)
Stable with gabapentin 300 mg tid  Overall tolerable

## 2021-08-05 NOTE — Progress Notes (Signed)
Subjective:    Patient ID: Ariel Compton, female    DOB: 04/23/1956, 65 y.o.   MRN: [id number]  This visit occurred during the SARS-CoV-2 public health emergency.  Safety protocols were in place, including screening questions prior to the visit, additional usage of staff PPE, and extensive cleaning of exam room while observing appropriate contact time as indicated for disinfecting solutions.   HPI Here for health maintenance exam and to review chronic medical problems    Wt Readings from Last 3 Encounters:  08/05/21 182 lb (82.6 kg)  05/17/21 182 lb 1.6 oz (82.6 kg)  02/08/21 182 lb 1.6 oz (82.6 kg)   32.76 kg/m  Mother died of pneumonia unexpectedly Hard on her but getting by with grief  Taking care of herself  Did some online psych care    Zoster status -interested in shingrix Covid immunized with booster  Flu shot-just [facility] at [facility]  Pna 23 2014  Colon cancer screening -wants cologuard   Mammogram 8/22 Self breast exam  Gyn -hysterectomy  Takes HRT estrace 0.5 mg daily Tried to wean and had trouble -feels good now   Tried taking it every other day  Mood swings and upset     BP Readings from Last 3 Encounters:  08/05/21 126/88  05/17/21 (!) 108/56  02/08/21 120/83   Pulse Readings from Last 3 Encounters:  08/05/21 89  05/17/21 88  02/08/21 77   Asthma - symbicort   Occipital neuralgia-takes gabapentin 600 mg tid  Always there- can work through it   Mood /depression  Wellbutrin 75 mg bid- doing better with that  Celexa 20 mg daily    Hyperlipidemia Lab Results  Component Value Date   CHOL 193 07/29/2021   CHOL 217 (H) 07/31/2020   CHOL 221 (H) 07/26/2019   Lab Results  Component Value Date   HDL 68.30 07/29/2021   HDL 57.30 07/31/2020   HDL 60.20 07/26/2019   Lab Results  Component Value Date   LDLCALC 108 (H) 07/29/2021   LDLCALC 134 (H) 07/31/2020   LDLCALC 133 (H) 07/26/2019   Lab Results  Component Value Date   TRIG 84.0  07/29/2021   TRIG 130.0 07/31/2020   TRIG 139.0 07/26/2019   Lab Results  Component Value Date   CHOLHDL 3 07/29/2021   CHOLHDL 4 07/31/2020   CHOLHDL 4 07/26/2019   Lab Results  Component Value Date   LDLDIRECT 114.0 01/15/2013   LDLDIRECT 121.4 10/25/2007   LDLDIRECT 121.4 10/23/2007   LDL is improved!  Staying away from processed foods  More veggies   Patient Active Problem List   Diagnosis Date Noted   Trochanteric bursitis 12/11/2019   Occipital neuralgia 02/07/2017   Colon cancer screening 01/22/2013   Routine general medical examination at a health care facility 11/11/2012   Other screening mammogram 07/23/2012   Postmenopausal HRT (hormone replacement therapy) 07/07/2011   DUPUYTREN'S CONTRACTURE 03/16/2010   MENOPAUSE, SURGICAL 09/04/2009   Asthma 08/11/2008   HYPERLIPIDEMIA 12/04/2007   Depression 12/04/2007   ALLERGIC RHINITIS 12/04/2007   BLADDER PROLAPSE 12/04/2007   Obesity 10/22/2007   Past Medical History:  Diagnosis Date   Allergic rhinitis    Asthma    mild   Contracture of palmar fascia    Depression    Headache    Mixed hyperlipidemia    Obesity, unspecified    Plantar fasciitis    Symptomatic states associated with artificial menopause    Unspecified disorder of bladder    Past  Surgical History:  Procedure Laterality Date   ANKLE FRACTURE SURGERY Right    VAGINAL HYSTERECTOMY  2008   Enterocele repair   Social History   Tobacco Use   Smoking status: Never   Smokeless tobacco: Never  Vaping Use   Vaping Use: Never used  Substance Use Topics   Alcohol use: No    Alcohol/week: 0.0 standard drinks   Drug use: No   Family History  Problem Relation Age of Onset   Peripheral vascular disease Father        Smoker   Prostate cancer Father    Heart failure Father    Other Father        Heart "problems"   Stroke Mother    Headache Neg Hx    Breast cancer Neg Hx    No Known Allergies Current Outpatient Medications on File  Prior to Visit  Medication Sig Dispense Refill   buPROPion (WELLBUTRIN) 75 MG tablet Take 75 mg by mouth 2 (two) times daily.     citalopram (CELEXA) 20 MG tablet Take 20 mg by mouth daily.     estradiol (ESTRACE) 0.5 MG tablet TAKE 1 TABLET(0.5 MG) BY MOUTH DAILY 30 tablet 11   methocarbamol (ROBAXIN) 500 MG tablet Take 1 tablet (500 mg total) by mouth every 8 (eight) hours as needed for muscle spasms. 90 tablet 0   Multiple Vitamin (MULTIVITAMIN) tablet Take 1 tablet by mouth daily.     zolpidem (AMBIEN) 10 MG tablet Take 1 tablet (10 mg total) by mouth at bedtime as needed for sleep (during travel). 30 tablet 0   No current facility-administered medications on file prior to visit.     Review of Systems  Constitutional:  Negative for activity change, appetite change, fatigue, fever and unexpected weight change.  HENT:  Negative for congestion, ear pain, rhinorrhea, sinus pressure and sore throat.   Eyes:  Negative for pain, redness and visual disturbance.  Respiratory:  Negative for cough, shortness of breath and wheezing.   Cardiovascular:  Negative for chest pain and palpitations.  Gastrointestinal:  Negative for abdominal pain, blood in stool, constipation and diarrhea.  Endocrine: Negative for polydipsia and polyuria.  Genitourinary:  Negative for dysuria, frequency and urgency.  Musculoskeletal:  Negative for arthralgias, back pain and myalgias.  Skin:  Negative for pallor and rash.  Allergic/Immunologic: Negative for environmental allergies.  Neurological:  Positive for headaches. Negative for dizziness and syncope.  Hematological:  Negative for adenopathy. Does not bruise/bleed easily.  Psychiatric/Behavioral:  Negative for decreased concentration and dysphoric mood. The patient is not nervous/anxious.        Grief      Objective:   Physical Exam Constitutional:      General: She is not in acute distress.    Appearance: Normal appearance. She is well-developed. She is  obese. She is not ill-appearing or diaphoretic.  HENT:     Head: Normocephalic and atraumatic.     Right Ear: Tympanic membrane, ear canal and external ear normal.     Left Ear: Tympanic membrane, ear canal and external ear normal.     Nose: Nose normal. No congestion.     Mouth/Throat:     Mouth: Mucous membranes are moist.     Pharynx: Oropharynx is clear. No posterior oropharyngeal erythema.  Eyes:     General: No scleral icterus.    Extraocular Movements: Extraocular movements intact.     Conjunctiva/sclera: Conjunctivae normal.     Pupils: Pupils are equal, round, and  reactive to light.  Neck:     Thyroid: No thyromegaly.     Vascular: No carotid bruit or JVD.  Cardiovascular:     Rate and Rhythm: Normal rate and regular rhythm.     Pulses: Normal pulses.     Heart sounds: Normal heart sounds.    No gallop.  Pulmonary:     Effort: Pulmonary effort is normal. No respiratory distress.     Breath sounds: Normal breath sounds. No wheezing.     Comments: Good air exch Chest:     Chest wall: No tenderness.  Abdominal:     General: Bowel sounds are normal. There is no distension or abdominal bruit.     Palpations: Abdomen is soft. There is no mass.     Tenderness: There is no abdominal tenderness.     Hernia: No hernia is present.  Genitourinary:    Comments: Breast exam: No mass, nodules, thickening, tenderness, bulging, retraction, inflamation, nipple discharge or skin changes noted.  No axillary or clavicular LA.     Musculoskeletal:        General: No tenderness. Normal range of motion.     Cervical back: Normal range of motion and neck supple. No rigidity. No muscular tenderness.     Right lower leg: No edema.     Left lower leg: No edema.     Comments: No kyphosis   Lymphadenopathy:     Cervical: No cervical adenopathy.  Skin:    General: Skin is warm and dry.     Coloration: Skin is not pale.     Findings: No erythema or rash.     Comments: Fair complexion  Solar  lentigines diffusely   Neurological:     Mental Status: She is alert. Mental status is at baseline.     Cranial Nerves: No cranial nerve deficit.     Motor: No abnormal muscle tone.     Coordination: Coordination normal.     Gait: Gait normal.     Deep Tendon Reflexes: Reflexes are normal and symmetric. Reflexes normal.  Psychiatric:        Mood and Affect: Mood normal.        Cognition and Memory: Cognition and memory normal.          Assessment & Plan:   Problem List Items Addressed This Visit       Respiratory   Asthma   Relevant Medications   fluticasone-salmeterol (ADVAIR DISKUS) 100-50 MCG/ACT AEPB   albuterol (VENTOLIN HFA) 108 (90 Base) MCG/ACT inhaler     Other   HYPERLIPIDEMIA    Disc goals for lipids and reasons to control them Rev last labs with pt Rev low sat fat diet in detail  LDL is improved-enc to keep up better diet      Obesity    Discussed how this problem influences overall health and the risks it imposes  Reviewed plan for weight loss with lower calorie diet (via better food choices and also portion control or program like weight watchers) and exercise building up to or more than 30 minutes 5 days per week including some aerobic activity         Depression    Fairly stable despite recent loss of her mother  Taking wellbutrin 75 mg bid and celexa 20 mg daily  Has psychiatrist and counseling on line Enc good self care Mood is positive today  Offered addn help wit grief if needed      Postmenopausal HRT (hormone replacement  therapy)    Estrace 0.5 mg daily Taking for mood and vasomotor symptoms  Disc plan to wean-pt struggled in the past  Wants to get through year with loss of mother before trying  Rev risks of breast cancer and blood clots but benefit for bone health  May consider slow wean in a year       Routine general medical examination at a health care facility - Primary    Reviewed health habits including diet and exercise and  skin cancer prevention Reviewed appropriate screening tests for age  Also reviewed health mt list, fam hx and immunization status , as well as social and family history   See HPI Labs ordered  Interested in shingrix -will check on coverage  Flu shot-recent  cologuard ordered  Mammogram utd  Discussed risks of HRT Enc vit D intake and exercise for bone health Mood is good/stable      Colon cancer screening    Discussed options Declines colonoscopy  cologuard ordered       Relevant Orders   Cologuard   Occipital neuralgia    Stable with gabapentin 300 mg tid  Overall tolerable

## 2021-08-17 ENCOUNTER — Encounter: Payer: Self-pay | Admitting: Family Medicine

## 2021-08-17 MED ORDER — BUPROPION HCL 75 MG PO TABS: 75.0000 mg | ORAL_TABLET | Freq: Two times a day (BID) | ORAL | 1 refills | Status: DC

## 2021-08-17 MED ORDER — CITALOPRAM HYDROBROMIDE 20 MG PO TABS: 20.0000 mg | ORAL_TABLET | Freq: Every day | ORAL | 1 refills | Status: DC

## 2021-08-20 ENCOUNTER — Other Ambulatory Visit: Payer: Self-pay | Admitting: Family Medicine

## 2021-09-03 ENCOUNTER — Other Ambulatory Visit: Payer: Self-pay | Admitting: Family Medicine

## 2021-09-06 MED ORDER — ZOLPIDEM TARTRATE 10 MG PO TABS: 10.0000 mg | ORAL_TABLET | Freq: Every evening | ORAL | 0 refills | Status: DC | PRN

## 2021-09-06 NOTE — Telephone Encounter (Signed)
Refill request Ambien Last office visit 08/05/21 Last refill 04/27/21 #30 No upcoming appointment scheduled

## 2021-10-06 ENCOUNTER — Ambulatory Visit
Admission: EM | Admit: 2021-10-06 | Discharge: 2021-10-06 | Disposition: A | Discharge status: Home / Self Care | Payer: Federal, State, Local not specified - PPO | Attending: Emergency Medicine | Admitting: Emergency Medicine

## 2021-10-06 ENCOUNTER — Other Ambulatory Visit: Payer: Self-pay

## 2021-10-06 DIAGNOSIS — J069 Acute upper respiratory infection, unspecified: Secondary | ICD-10-CM | POA: Insufficient documentation

## 2021-10-06 LAB — GROUP A STREP BY PCR: Group A Strep by PCR: NOT DETECTED

## 2021-10-06 MED ORDER — AMOXICILLIN-POT CLAVULANATE 875-125 MG PO TABS: 1.0000 | ORAL_TABLET | Freq: Two times a day (BID) | ORAL | 0 refills | Status: AC

## 2021-10-06 MED ORDER — PROMETHAZINE-DM 6.25-15 MG/5ML PO SYRP: 5.0000 mL | ORAL_SOLUTION | Freq: Four times a day (QID) | ORAL | 0 refills | Status: DC | PRN

## 2021-10-06 MED ORDER — IPRATROPIUM BROMIDE 0.06 % NA SOLN: 2.0000 | Freq: Four times a day (QID) | NASAL | 12 refills | Status: AC

## 2021-10-06 MED ORDER — BENZONATATE 100 MG PO CAPS: 200.0000 mg | ORAL_CAPSULE | Freq: Three times a day (TID) | ORAL | 0 refills | Status: DC

## 2021-10-06 NOTE — ED Triage Notes (Signed)
Pt reports having a sore throat x10 days. Also sts she has had a low grade fever and productive cough.

## 2021-10-06 NOTE — ED Provider Notes (Signed)
[facility]    CSN: [id number] Arrival date & time: 10/06/21  3845      History   Chief Complaint Chief Complaint  Patient presents with   Sore Throat    HPI Ariel Compton is a 65 y.o. female.   HPI  28 old female here for evaluation of respiratory complaints.  Patient reports that for the last 10 days she has been experiencing a sore throat, which she indicates is her most significant symptom, and she would like to be evaluated for strep.  This has been with an associated fever of a T-max of 101, headache, runny nose with green nasal discharge, ear pain, shortness of breath and wheezing.  Patient does have a history of asthma and reports that she uses her albuterol inhaler which helps resolve the symptoms.  She also has intermittently productive cough for the same yellow-green sputum that she is discharging from her nose.  She denies any GI symptoms or body aches.  Past Medical History:  Diagnosis Date   Allergic rhinitis    Asthma    mild   Contracture of palmar fascia    Depression    Headache    Mixed hyperlipidemia    Obesity, unspecified    Plantar fasciitis    Symptomatic states associated with artificial menopause    Unspecified disorder of bladder     Patient Active Problem List   Diagnosis Date Noted   Trochanteric bursitis 12/11/2019   Occipital neuralgia 02/07/2017   Colon cancer screening 01/22/2013   Routine general medical examination at a health care facility 11/11/2012   Other screening mammogram 07/23/2012   Postmenopausal HRT (hormone replacement therapy) 07/07/2011   DUPUYTREN'S CONTRACTURE 03/16/2010   MENOPAUSE, SURGICAL 09/04/2009   Asthma 08/11/2008   HYPERLIPIDEMIA 12/04/2007   Depression 12/04/2007   ALLERGIC RHINITIS 12/04/2007   BLADDER PROLAPSE 12/04/2007   Obesity 10/22/2007    Past Surgical History:  Procedure Laterality Date   ANKLE FRACTURE SURGERY Right    VAGINAL HYSTERECTOMY  2008   Enterocele repair     OB History   No obstetric history on file.      Home Medications    Prior to Admission medications   Medication Sig Start Date End Date Taking? Authorizing Provider  amoxicillin-clavulanate (AUGMENTIN) 875-125 MG tablet Take 1 tablet by mouth every 12 (twelve) hours for 10 days. 10/06/21 10/16/21 Yes Margarette Canada, NP  benzonatate (TESSALON) 100 MG capsule Take 2 capsules (200 mg total) by mouth every 8 (eight) hours. 10/06/21  Yes Margarette Canada, NP  ipratropium (ATROVENT) 0.06 % nasal spray Place 2 sprays into both nostrils 4 (four) times daily. 10/06/21  Yes Margarette Canada, NP  promethazine-dextromethorphan (PROMETHAZINE-DM) 6.25-15 MG/5ML syrup Take 5 mLs by mouth 4 (four) times daily as needed. 10/06/21  Yes Margarette Canada, NP  albuterol (VENTOLIN HFA) 108 (90 Base) MCG/ACT inhaler Inhale 2 puffs into the lungs every 4 (four) hours as needed for wheezing or shortness of breath (or cough). 08/05/21   [name], Wynelle Fanny, MD  buPROPion (WELLBUTRIN) 75 MG tablet Take 1 tablet (75 mg total) by mouth 2 (two) times daily. 08/17/21   [name], Wynelle Fanny, MD  citalopram (CELEXA) 20 MG tablet Take 1 tablet (20 mg total) by mouth daily. 08/17/21   [name], Wynelle Fanny, MD  estradiol (ESTRACE) 0.5 MG tablet TAKE 1 TABLET(0.5 MG) BY MOUTH DAILY 02/01/21   [name], Ripley A, MD  fluticasone-salmeterol (ADVAIR DISKUS) 100-50 MCG/ACT AEPB Inhale 1 puff into the lungs 2 (  two) times daily. 08/05/21   [name], Wynelle Fanny, MD  gabapentin (NEURONTIN) 600 MG tablet Take 1 tablet (600 mg total) by mouth 3 (three) times daily. 08/05/21   [name], Wynelle Fanny, MD  methocarbamol (ROBAXIN) 500 MG tablet Take 1 tablet (500 mg total) by mouth every 8 (eight) hours as needed for muscle spasms. 04/13/20   [name], Wynelle Fanny, MD  Multiple Vitamin (MULTIVITAMIN) tablet Take 1 tablet by mouth daily.    [provider]  zolpidem (AMBIEN) 10 MG tablet Take 1 tablet (10 mg total) by mouth at bedtime as needed for sleep (during travel). 09/06/21 10/06/21  [name],  Wynelle Fanny, MD    Family History Family History  Problem Relation Age of Onset   Peripheral vascular disease Father        Smoker   Prostate cancer Father    Heart failure Father    Other Father        Heart "problems"   Stroke Mother    Headache Neg Hx    Breast cancer Neg Hx     Social History Social History   Tobacco Use   Smoking status: Never   Smokeless tobacco: Never  Vaping Use   Vaping Use: Never used  Substance Use Topics   Alcohol use: No    Alcohol/week: 0.0 standard drinks   Drug use: No     Allergies   Patient has no known allergies.   Review of Systems Review of Systems  Constitutional:  Positive for fever. Negative for activity change and appetite change.  HENT:  Positive for congestion, ear pain, postnasal drip, rhinorrhea and sore throat.   Respiratory:  Positive for cough, shortness of breath and wheezing.   Gastrointestinal:  Negative for diarrhea, nausea and vomiting.  Musculoskeletal:  Negative for arthralgias and myalgias.  Skin:  Negative for rash.  Neurological:  Positive for headaches.  Hematological: Negative.   Psychiatric/Behavioral: Negative.      Physical Exam Triage Vital Signs ED Triage Vitals [10/06/21 0954]  Enc Vitals Group     BP 115/75     Pulse Rate 62     Resp 18     Temp 98.9 F (37.2 C)     Temp Source Oral     SpO2 99 %     Weight 187 lb (84.8 kg)     Height _0  (1.626 m)     Head Circumference      Peak Flow      Pain Score 7     Pain Loc      Pain Edu?      Excl. in [place]?    No data found.  Updated Vital Signs BP 115/75   Pulse 62   Temp 98.9 F (37.2 C) (Oral)   Resp 18   Ht _1  (1.626 m)   Wt 187 lb (84.8 kg)   SpO2 99%   BMI 32.10 kg/m   Visual Acuity Right Eye Distance:   Left Eye Distance:   Bilateral Distance:    Right Eye Near:   Left Eye Near:    Bilateral Near:     Physical Exam Vitals and nursing note reviewed.  Constitutional:      General: She is not in acute  distress.    Appearance: Normal appearance. She is not ill-appearing.  HENT:     Head: Normocephalic and atraumatic.     Right Ear: Tympanic membrane, ear canal and external ear normal. There is no impacted cerumen.  Left Ear: Tympanic membrane, ear canal and external ear normal. There is no impacted cerumen.     Nose: Congestion and rhinorrhea present.     Mouth/Throat:     Mouth: Mucous membranes are moist.     Pharynx: Oropharynx is clear. Posterior oropharyngeal erythema present.  Cardiovascular:     Rate and Rhythm: Normal rate and regular rhythm.     Pulses: Normal pulses.     Heart sounds: Normal heart sounds. No murmur heard.   No gallop.  Pulmonary:     Effort: Pulmonary effort is normal.     Breath sounds: Normal breath sounds. No wheezing, rhonchi or rales.  Musculoskeletal:     Cervical back: Normal range of motion and neck supple.  Lymphadenopathy:     Cervical: No cervical adenopathy.  Skin:    General: Skin is warm and dry.     Capillary Refill: Capillary refill takes less than 2 seconds.     Findings: No erythema or rash.  Neurological:     General: No focal deficit present.     Mental Status: She is alert and oriented to person, place, and time.  Psychiatric:        Mood and Affect: Mood normal.        Behavior: Behavior normal.        Thought Content: Thought content normal.        Judgment: Judgment normal.     UC Treatments / Results  Labs (all labs ordered are listed, but only abnormal results are displayed) Labs Reviewed  GROUP A STREP BY PCR    EKG   Radiology No results found.  Procedures Procedures (including critical care time)  Medications Ordered in UC Medications - No data to display  Initial Impression / Assessment and Plan / UC Course  I have reviewed the triage vital signs and the nursing notes.  Pertinent labs & imaging results that were available during my care of the patient were reviewed by me and considered in my  medical decision making (see chart for details).  Patient is a very pleasant and nontoxic-appearing 8 old female here for evaluation of respiratory complaints as outlined in HPI above.  Patient's physical exam reveals protegrin tympanic membranes bilaterally with normal light reflex and clear external auditory canals.  Nasal mucosa is erythematous edematous with purulent nasal discharge in both nares.  Patient denies tenderness to percussion of frontal or maxillary sinuses.  Oropharyngeal exam reveals posterior oropharyngeal erythema with yellow postnasal drip.  No cervical lymphadenopathy appreciated exam.  Cardiopulmonary exam reveals clear lung sounds in all fields.  Patient's exam is consistent with an upper respiratory infection and I believe that her postnasal drip is what is driving the cough.  Due to the protracted duration of symptoms we will do a trial of antibiotics with Augmentin twice daily for 10 days.  I have encouraged the patient to perform sinus irrigation to help alleviate her mucus burden and also to take probiotics while she is on the antibiotics to prevent diarrhea yeast infections if possible.  Atrovent nasal spray prescribed to help with nasal congestion as well as Tessalon Perles and Promethazine DM cough syrup.  Work note provided.   Final Clinical Impressions(s) / UC Diagnoses   Final diagnoses:  Upper respiratory tract infection, unspecified type     Discharge Instructions      The Augmentin twice daily with food for 10 days for treatment of your URI.  Perform sinus irrigation 2-3 times a day with  a [employer] sinus rinse kit and distilled water.  Do not use tap water.  You can use plain over-the-counter Mucinex every 6 hours to break up the stickiness of the mucus so your body can clear it.  Use the Atrovent nasal spray, 2 squirts in each nostril every 6 hours, as needed for runny nose and postnasal drip.  Use the Tessalon Perles every 8 hours during the day.  Take  them with a small sip of water.  They may give you some numbness to the base of your tongue or a metallic taste in your mouth, this is normal.  Use the Promethazine DM cough syrup at bedtime for cough and congestion.  It will make you drowsy so do not take it during the day.  Increase your oral fluid intake to thin out your mucus so that is also able for your body to clear more easily.  Take an over-the-counter probiotic, such as Culturelle-align-activia, 1 hour after each dose of antibiotic to prevent diarrhea.  Continue to use Albuterol as needed for shortness of breath or wheezing.   If you develop any new or worsening symptoms return for reevaluation or see your primary care provider.      ED Prescriptions     Medication Sig Dispense Auth. Provider   amoxicillin-clavulanate (AUGMENTIN) 875-125 MG tablet Take 1 tablet by mouth every 12 (twelve) hours for 10 days. 20 tablet Margarette Canada, NP   ipratropium (ATROVENT) 0.06 % nasal spray Place 2 sprays into both nostrils 4 (four) times daily. 15 mL Margarette Canada, NP   benzonatate (TESSALON) 100 MG capsule Take 2 capsules (200 mg total) by mouth every 8 (eight) hours. 21 capsule Margarette Canada, NP   promethazine-dextromethorphan (PROMETHAZINE-DM) 6.25-15 MG/5ML syrup Take 5 mLs by mouth 4 (four) times daily as needed. 118 mL Margarette Canada, NP      PDMP not reviewed this encounter.   Margarette Canada, NP 10/06/21 1041

## 2021-10-06 NOTE — Discharge Instructions (Signed)
The Augmentin twice daily with food for 10 days for treatment of your URI.  Perform sinus irrigation 2-3 times a day with a [employer]Med sinus rinse kit and distilled water.  Do not use tap water.  You can use plain over-the-counter Mucinex every 6 hours to break up the stickiness of the mucus so your body can clear it.  Use the Atrovent nasal spray, 2 squirts in each nostril every 6 hours, as needed for runny nose and postnasal drip.  Use the Tessalon Perles every 8 hours during the day.  Take them with a small sip of water.  They may give you some numbness to the base of your tongue or a metallic taste in your mouth, this is normal.  Use the Promethazine DM cough syrup at bedtime for cough and congestion.  It will make you drowsy so do not take it during the day.  Increase your oral fluid intake to thin out your mucus so that is also able for your body to clear more easily.  Take an over-the-counter probiotic, such as Culturelle-align-activia, 1 hour after each dose of antibiotic to prevent diarrhea.  Continue to use Albuterol as needed for shortness of breath or wheezing.   If you develop any new or worsening symptoms return for reevaluation or see your primary care provider.

## 2022-01-05 ENCOUNTER — Other Ambulatory Visit: Payer: Self-pay | Admitting: Family Medicine

## 2022-01-06 MED ORDER — ZOLPIDEM TARTRATE 10 MG PO TABS
10.0000 mg | ORAL_TABLET | Freq: Every evening | ORAL | 0 refills | Status: DC | PRN
Start: 2022-01-06 — End: 2022-04-17

## 2022-01-06 NOTE — Telephone Encounter (Signed)
Name of Medication: Lorrin Mais  Name of Pharmacy: [facility]. [address] Last Fill or Written Date and Quantity: 09/06/21 #30 tabs/ 0 refills  Last Office Visit and Type: CPE on 08/05/21 Next Office Visit and Type: none scheduled

## 2022-01-22 ENCOUNTER — Encounter: Payer: Self-pay | Admitting: Family Medicine

## 2022-01-24 MED ORDER — ESTRADIOL 0.5 MG PO TABS: ORAL_TABLET | ORAL | 5 refills | Status: DC

## 2022-01-31 ENCOUNTER — Other Ambulatory Visit: Payer: Self-pay | Admitting: Family Medicine

## 2022-03-14 DIAGNOSIS — M65331 Trigger finger, right middle finger: Secondary | ICD-10-CM | POA: Diagnosis not present

## 2022-04-16 ENCOUNTER — Ambulatory Visit
Admission: RE | Admit: 2022-04-16 | Discharge: 2022-04-16 | Disposition: A | Discharge status: Home / Self Care | Payer: Federal, State, Local not specified - PPO | Source: Ambulatory Visit

## 2022-04-16 ENCOUNTER — Ambulatory Visit (INDEPENDENT_AMBULATORY_CARE_PROVIDER_SITE_OTHER): Payer: Federal, State, Local not specified - PPO

## 2022-04-16 VITALS — BP 136/81 | HR 68 | Temp 97.8°F | Resp 18 | Ht 64.0 in | Wt 180.0 lb

## 2022-04-16 DIAGNOSIS — S8002XA Contusion of left knee, initial encounter: Secondary | ICD-10-CM

## 2022-04-16 DIAGNOSIS — M25562 Pain in left knee: Secondary | ICD-10-CM | POA: Diagnosis not present

## 2022-04-16 NOTE — ED Triage Notes (Signed)
Patient is here for "Left knee pain, fell at airport about a week ago over luggage". Knee is warm and bruised. No lacerations. No abrasions.  ?

## 2022-04-16 NOTE — ED Provider Notes (Signed)
?[facility] ? ? ? ?CSN: [id number] ?Arrival date & time: 04/16/22  1238 ? ? ?  ? ?History   ?Chief Complaint ?Chief Complaint  ?Patient presents with  ? Knee Injury  ?  tripped a week ago.  still having knee pain - Entered by patient  ? ? ?HPI ?Ariel Compton is a 66 y.o. female.  ? ?HPI ? ?66 year old female here for evaluation of left knee pain. ? ?Patient reports that approximately 1 week ago she tripped over some luggage at the airport and landed on her left knee.  Since then she has been experiencing pain that is mostly present when she goes to sleep at night.  She says that she does not have pain when she weightbears or ambulates.  She does endorse bruising to the front part of her shin and states that she did have swelling initially but that is resolved.  She denies any numbness or tingling in her lower extremity below her knee. ? ?Past Medical History:  ?Diagnosis Date  ? Allergic rhinitis   ? Asthma   ? mild  ? Contracture of palmar fascia   ? Depression   ? Headache   ? Mixed hyperlipidemia   ? Obesity, unspecified   ? Plantar fasciitis   ? Symptomatic states associated with artificial menopause   ? Unspecified disorder of bladder   ? ? ?Patient Active Problem List  ? Diagnosis Date Noted  ? Trochanteric bursitis 12/11/2019  ? Left leg pain 07/26/2018  ? Occipital neuralgia of right side 02/07/2017  ? Colon cancer screening 01/22/2013  ? Routine general medical examination at a health care facility 11/11/2012  ? Routine history and physical examination of adult 11/11/2012  ? Other screening mammogram 07/23/2012  ? Postmenopausal HRT (hormone replacement therapy) 07/07/2011  ? Trigger middle finger of right hand 03/16/2010  ? MENOPAUSE, SURGICAL 09/04/2009  ? Asthma 08/11/2008  ? HYPERLIPIDEMIA 12/04/2007  ? Depression 12/04/2007  ? Allergic rhinitis 12/04/2007  ? Disorder of bladder 12/04/2007  ? Obesity 10/22/2007  ? ? ?Past Surgical History:  ?Procedure Laterality Date  ? ANKLE FRACTURE  SURGERY Right   ? VAGINAL HYSTERECTOMY  2008  ? Enterocele repair  ? ? ?OB History   ?No obstetric history on file. ?  ? ? ? ?Home Medications   ? ?Prior to Admission medications   ?Medication Sig Start Date End Date Taking? Authorizing Provider  ?albuterol (VENTOLIN HFA) 108 (90 Base) MCG/ACT inhaler Inhale 2 puffs into the lungs every 4 (four) hours as needed for wheezing or shortness of breath (or cough). 08/05/21  Yes [name], Wynelle Fanny, MD  ?citalopram (CELEXA) 20 MG tablet TAKE 1 TABLET(20 MG) BY MOUTH DAILY 01/31/22  Yes [name], Wynelle Fanny, MD  ?estradiol (ESTRACE) 0.5 MG tablet TAKE 1 TABLET(0.5 MG) BY MOUTH DAILY 01/24/22  Yes [name], Wynelle Fanny, MD  ?gabapentin (NEURONTIN) 600 MG tablet Take 1 tablet (600 mg total) by mouth 3 (three) times daily. 08/05/21  Yes [name], Wynelle Fanny, MD  ?azithromycin (ZITHROMAX) 250 MG tablet Take by mouth. 11/20/21   [provider]  ?benzonatate (TESSALON) 200 MG capsule Take 400 mg by mouth every 8 (eight) hours as needed. 11/20/21   [provider]  ?fluticasone-salmeterol (ADVAIR DISKUS) 100-50 MCG/ACT AEPB Inhale 1 puff into the lungs 2 (two) times daily. 08/05/21   [name], Wynelle Fanny, MD  ?ipratropium (ATROVENT) 0.06 % nasal spray Place 2 sprays into both nostrils 4 (four) times daily. 10/06/21   Margarette Canada, NP  ?  Multiple Vitamin (MULTIVITAMIN) tablet Take 1 tablet by mouth daily.    [provider]  ?zolpidem (AMBIEN) 10 MG tablet Take 1 tablet (10 mg total) by mouth at bedtime as needed for sleep (during travel). 01/06/22 02/05/22  Abner Greenspan, MD  ? ? ?Family History ?Family History  ?Problem Relation Age of Onset  ? Peripheral vascular disease Father   ?     Smoker  ? Prostate cancer Father   ? Heart failure Father   ? Other Father   ?     Heart "problems"  ? Stroke Mother   ? Headache Neg Hx   ? Breast cancer Neg Hx   ? ? ?Social History ?Social History  ? ?Tobacco Use  ? Smoking status: Never  ? Smokeless tobacco: Never  ?Vaping Use  ? Vaping Use: Never used   ?Substance Use Topics  ? Alcohol use: No  ?  Alcohol/week: 0.0 standard drinks  ? Drug use: No  ? ? ? ?Allergies   ?Patient has no known allergies. ? ? ?Review of Systems ?Review of Systems  ?Musculoskeletal:  Positive for arthralgias and joint swelling.  ?Skin:  Positive for color change. Negative for wound.  ?Neurological:  Negative for weakness and numbness.  ?Hematological: Negative.   ?Psychiatric/Behavioral: Negative.    ? ? ?Physical Exam ?Triage Vital Signs ?ED Triage Vitals  ?Enc Vitals Group  ?   BP 04/16/22 1247 136/81  ?   Pulse Rate 04/16/22 1247 68  ?   Resp 04/16/22 1247 18  ?   Temp 04/16/22 1247 97.8 ?F (36.6 ?C)  ?   Temp Source 04/16/22 1247 Oral  ?   SpO2 04/16/22 1247 96 %  ?   Weight 04/16/22 1245 180 lb (81.6 kg)  ?   Height 04/16/22 1245 5\' 4"  (1.626 m)  ?   Head Circumference --   ?   Peak Flow --   ?   Pain Score 04/16/22 1243 5  ?   Pain Loc --   ?   Pain Edu? --   ?   Excl. in [place]? --   ? ?No data found. ? ?Updated Vital Signs ?BP 136/81 (BP Location: Left Arm)   Pulse 68   Temp 97.8 ?F (36.6 ?C) (Oral)   Resp 18   Ht 5\' 4"  (1.626 m)   Wt 180 lb (81.6 kg)   SpO2 96%   BMI 30.90 kg/m?  ? ?Visual Acuity ?Right Eye Distance:   ?Left Eye Distance:   ?Bilateral Distance:   ? ?Right Eye Near:   ?Left Eye Near:    ?Bilateral Near:    ? ?Physical Exam ?Vitals and nursing note reviewed.  ?Constitutional:   ?   Appearance: Normal appearance. She is not ill-appearing.  ?HENT:  ?   Head: Normocephalic and atraumatic.  ?Musculoskeletal:     ?   General: Tenderness and signs of injury present. No swelling or deformity. Normal range of motion.  ?Skin: ?   General: Skin is warm and dry.  ?   Capillary Refill: Capillary refill takes less than 2 seconds.  ?   Findings: Bruising present. No erythema.  ?Neurological:  ?   General: No focal deficit present.  ?   Mental Status: She is alert and oriented to person, place, and time.  ?   Sensory: No sensory deficit.  ?   Motor: No weakness.  ?   Gait:  Gait normal.  ?Psychiatric:     ?  Mood and Affect: Mood normal.     ?   Behavior: Behavior normal.     ?   Thought Content: Thought content normal.     ?   Judgment: Judgment normal.  ? ? ? ?UC Treatments / Results  ?Labs ?(all labs ordered are listed, but only abnormal results are displayed) ?Labs Reviewed - No data to display ? ?EKG ? ? ?Radiology ?DG Knee Complete 4 Views Left ? ?Result Date: 04/16/2022 ?CLINICAL DATA:  Pain, patient tripped over luggage at airport. Bruising on anterior knee. EXAM: LEFT KNEE - COMPLETE 4+ VIEW COMPARISON:  None Available. FINDINGS: No evidence of fracture, dislocation, or joint effusion. No evidence of arthropathy or other focal bone abnormality. Soft tissues are unremarkable. IMPRESSION: Negative. Electronically Signed   By: Audie Pinto M.D.   On: 04/16/2022 13:04   ? ?Procedures ?Procedures (including critical care time) ? ?Medications Ordered in UC ?Medications - No data to display ? ?Initial Impression / Assessment and Plan / UC Course  ?I have reviewed the triage vital signs and the nursing notes. ? ?Pertinent labs & imaging results that were available during my care of the patient were reviewed by me and considered in my medical decision making (see chart for details). ? ?Patient is a very pleasant, nontoxic-appearing 66 year old female here for evaluation of left knee pain that is been on for the past week after tripping over some luggage and landing on her knee while in the airport.  She denies pain with ambulation or weightbearing and states that the pain is mostly at night.  She has been using over-the-counter Tylenol and aspirin in conjunction with her gabapentin to control the pain.  She is concerned because she is continue to have pain a week later and she would rule out hairline fracture.  On exam patient's left knee is in normal anatomical alignment.  There is no tenderness with palpation of the patella, medial lateral joint line, or popliteal fossa.  Patient  does have some tenderness with palpation over the tibial tuberosity and the anterior portion of the tibia.  There is bruising that is yellowing over the tibial tuberosity and extending inferiorly for approximately

## 2022-04-16 NOTE — Discharge Instructions (Addendum)
Keep your left knee elevated is much as possible to help with pain and inflammation. ? ?Continue use over-the-counter Tylenol and aspirin as needed for pain control. ? ?You can also apply ice to your knee for 20 minutes at a time 2-3 times a day to help with pain and inflammation. ? ?Please return for reevaluation for new or continued symptoms. ?

## 2022-04-17 ENCOUNTER — Other Ambulatory Visit: Payer: Self-pay | Admitting: Family Medicine

## 2022-04-18 MED ORDER — ZOLPIDEM TARTRATE 10 MG PO TABS
10.0000 mg | ORAL_TABLET | Freq: Every evening | ORAL | 0 refills | Status: DC | PRN
Start: 2022-04-18 — End: 2022-08-08

## 2022-04-18 NOTE — Telephone Encounter (Signed)
Last filled 08/05/21 ?01/06/21 ov  ?OV none  ?

## 2022-04-26 DIAGNOSIS — M65331 Trigger finger, right middle finger: Secondary | ICD-10-CM | POA: Diagnosis not present

## 2022-05-30 ENCOUNTER — Other Ambulatory Visit: Payer: Self-pay | Admitting: Family Medicine

## 2022-05-30 DIAGNOSIS — Z1231 Encounter for screening mammogram for malignant neoplasm of breast: Secondary | ICD-10-CM

## 2022-06-28 ENCOUNTER — Other Ambulatory Visit: Payer: Self-pay | Admitting: Family Medicine

## 2022-06-28 ENCOUNTER — Encounter: Payer: Self-pay | Admitting: Family Medicine

## 2022-06-28 NOTE — Telephone Encounter (Signed)
Pt let her medication at her hotel room while she was on vacation

## 2022-07-09 ENCOUNTER — Other Ambulatory Visit: Payer: Self-pay | Admitting: Family Medicine

## 2022-07-28 ENCOUNTER — Encounter: Payer: Self-pay | Admitting: Family Medicine

## 2022-07-29 ENCOUNTER — Ambulatory Visit: Payer: Federal, State, Local not specified - PPO | Admitting: Family Medicine

## 2022-07-31 ENCOUNTER — Telehealth: Payer: Self-pay | Admitting: Family Medicine

## 2022-07-31 DIAGNOSIS — Z0000 Encounter for general adult medical examination without abnormal findings: Secondary | ICD-10-CM

## 2022-07-31 DIAGNOSIS — E782 Mixed hyperlipidemia: Secondary | ICD-10-CM

## 2022-07-31 NOTE — Telephone Encounter (Signed)
-----   Message from Alvina Chou sent at 07/21/2022  3:32 PM EDT ----- Regarding: Lab orders for Monday, 8.21.23 Patient is scheduled for CPX labs, please order future labs, Thanks , Camelia Eng

## 2022-08-01 ENCOUNTER — Other Ambulatory Visit (INDEPENDENT_AMBULATORY_CARE_PROVIDER_SITE_OTHER): Payer: Federal, State, Local not specified - PPO

## 2022-08-01 DIAGNOSIS — Z0000 Encounter for general adult medical examination without abnormal findings: Secondary | ICD-10-CM

## 2022-08-01 DIAGNOSIS — E782 Mixed hyperlipidemia: Secondary | ICD-10-CM | POA: Diagnosis not present

## 2022-08-01 LAB — CBC WITH DIFFERENTIAL/PLATELET
Basophils Absolute: 0.1 10*3/uL (ref 0.0–0.1)
Basophils Relative: 1.2 % (ref 0.0–3.0)
Eosinophils Absolute: 0.3 10*3/uL (ref 0.0–0.7)
Eosinophils Relative: 5.2 % — ABNORMAL HIGH (ref 0.0–5.0)
HCT: 37.3 % (ref 36.0–46.0)
Hemoglobin: 12.6 g/dL (ref 12.0–15.0)
Lymphocytes Relative: 36.2 % (ref 12.0–46.0)
Lymphs Abs: 2.1 10*3/uL (ref 0.7–4.0)
MCHC: 33.8 g/dL (ref 30.0–36.0)
MCV: 90.8 fl (ref 78.0–100.0)
Monocytes Absolute: 0.4 10*3/uL (ref 0.1–1.0)
Monocytes Relative: 6.6 % (ref 3.0–12.0)
Neutro Abs: 3 10*3/uL (ref 1.4–7.7)
Neutrophils Relative %: 50.8 % (ref 43.0–77.0)
Platelets: 311 10*3/uL (ref 150.0–400.0)
RBC: 4.11 Mil/uL (ref 3.87–5.11)
RDW: 13.4 % (ref 11.5–15.5)
WBC: 5.9 10*3/uL (ref 4.0–10.5)

## 2022-08-01 LAB — COMPREHENSIVE METABOLIC PANEL
ALT: 10 U/L (ref 0–35)
AST: 15 U/L (ref 0–37)
Albumin: 3.9 g/dL (ref 3.5–5.2)
Alkaline Phosphatase: 64 U/L (ref 39–117)
BUN: 11 mg/dL (ref 6–23)
CO2: 31 mEq/L (ref 19–32)
Calcium: 9 mg/dL (ref 8.4–10.5)
Chloride: 106 mEq/L (ref 96–112)
Creatinine, Ser: 0.65 mg/dL (ref 0.40–1.20)
GFR: 92.88 mL/min (ref >60.00)
Glucose, Bld: 105 mg/dL — ABNORMAL HIGH (ref 70–99)
Potassium: 4.6 mEq/L (ref 3.5–5.1)
Sodium: 138 mEq/L (ref 135–145)
Total Bilirubin: 0.5 mg/dL (ref 0.2–1.2)
Total Protein: 6.5 g/dL (ref 6.0–8.3)

## 2022-08-01 LAB — LIPID PANEL
Cholesterol: 181 mg/dL (ref 0–200)
HDL: 56.7 mg/dL (ref >39.00)
LDL Cholesterol: 103 mg/dL — ABNORMAL HIGH (ref 0–99)
NonHDL: 124.05
Total CHOL/HDL Ratio: 3
Triglycerides: 107 mg/dL (ref 0.0–149.0)
VLDL: 21.4 mg/dL (ref 0.0–40.0)

## 2022-08-01 LAB — TSH: TSH: 2.37 u[IU]/mL (ref 0.35–5.50)

## 2022-08-02 ENCOUNTER — Encounter: Payer: Self-pay | Admitting: Family Medicine

## 2022-08-02 ENCOUNTER — Ambulatory Visit: Payer: Federal, State, Local not specified - PPO | Admitting: Family Medicine

## 2022-08-02 DIAGNOSIS — S32019D Unspecified fracture of first lumbar vertebra, subsequent encounter for fracture with routine healing: Secondary | ICD-10-CM

## 2022-08-02 DIAGNOSIS — Z8781 Personal history of (healed) traumatic fracture: Secondary | ICD-10-CM | POA: Insufficient documentation

## 2022-08-02 DIAGNOSIS — S32019S Unspecified fracture of first lumbar vertebra, sequela: Secondary | ICD-10-CM

## 2022-08-02 DIAGNOSIS — S32019A Unspecified fracture of first lumbar vertebra, initial encounter for closed fracture: Secondary | ICD-10-CM | POA: Insufficient documentation

## 2022-08-02 MED ORDER — METHOCARBAMOL 500 MG PO TABS: 500.0000 mg | ORAL_TABLET | Freq: Three times a day (TID) | ORAL | 1 refills | Status: DC | PRN

## 2022-08-02 MED ORDER — MELOXICAM 15 MG PO TABS: 15.0000 mg | ORAL_TABLET | Freq: Every day | ORAL | 0 refills | Status: DC | PRN
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
## 2022-08-02 NOTE — Assessment & Plan Note (Signed)
[place]  Reviewed hospital records, lab results and studies in detail   In brace and gradually improving Cannot sit for a full work day-note to be out this week  Plan xr at the one month point  Enc her to take ca and D Px meloxicam 15 mg to use daily with food as needed Also methocarbamol with caution of sedation  If symptoms worsen consider ortho ref

## 2022-08-02 NOTE — Patient Instructions (Addendum)
Wear your brace   Let's get another lumbar xray around sept 11   Try meloxicam 15 mg daily with food as needed  Methocarbamol is a muscle relaxer It may sedate - use caution

## 2022-08-03 ENCOUNTER — Telehealth: Payer: Self-pay | Admitting: Family Medicine

## 2022-08-03 DIAGNOSIS — Z0279 Encounter for issue of other medical certificate: Secondary | ICD-10-CM

## 2022-08-03 NOTE — Telephone Encounter (Signed)
Type of forms received:FMLA  Routed to:Shapale W  Paperwork received by : Lesly Rubenstein   Individual made aware of 3-5 business day turn around (Y/N): Y  Form completed and patient made aware of charges(Y/N):Y   Faxed to :   Form location: Place in PCP folder

## 2022-08-03 NOTE — Telephone Encounter (Signed)
Forms are on Dr.tower desk.

## 2022-08-04 NOTE — Telephone Encounter (Signed)
Called and spoke with patient informed pt that paperwork has been completed , pt said that she will pick this up today.

## 2022-08-04 NOTE — Telephone Encounter (Signed)
Done and in IN box 

## 2022-08-08 ENCOUNTER — Encounter: Payer: Self-pay | Admitting: Family Medicine

## 2022-08-08 ENCOUNTER — Ambulatory Visit (INDEPENDENT_AMBULATORY_CARE_PROVIDER_SITE_OTHER): Payer: Federal, State, Local not specified - PPO | Admitting: Family Medicine

## 2022-08-08 VITALS — BP 134/82 | HR 78 | Temp 97.3°F | Ht 62.25 in | Wt 168.2 lb

## 2022-08-08 DIAGNOSIS — Z1211 Encounter for screening for malignant neoplasm of colon: Secondary | ICD-10-CM

## 2022-08-08 DIAGNOSIS — R7303 Prediabetes: Secondary | ICD-10-CM | POA: Insufficient documentation

## 2022-08-08 DIAGNOSIS — S32019D Unspecified fracture of first lumbar vertebra, subsequent encounter for fracture with routine healing: Secondary | ICD-10-CM | POA: Diagnosis not present

## 2022-08-08 DIAGNOSIS — F341 Dysthymic disorder: Secondary | ICD-10-CM

## 2022-08-08 DIAGNOSIS — Z0000 Encounter for general adult medical examination without abnormal findings: Secondary | ICD-10-CM

## 2022-08-08 DIAGNOSIS — E782 Mixed hyperlipidemia: Secondary | ICD-10-CM | POA: Diagnosis not present

## 2022-08-08 DIAGNOSIS — R7309 Other abnormal glucose: Secondary | ICD-10-CM

## 2022-08-08 DIAGNOSIS — S32019S Unspecified fracture of first lumbar vertebra, sequela: Secondary | ICD-10-CM

## 2022-08-08 MED ORDER — ZOLPIDEM TARTRATE 10 MG PO TABS
10.0000 mg | ORAL_TABLET | Freq: Every evening | ORAL | 0 refills | Status: DC | PRN
Start: 2022-08-08 — End: 2022-12-01

## 2022-08-08 MED ORDER — CITALOPRAM HYDROBROMIDE 20 MG PO TABS
ORAL_TABLET | ORAL | 3 refills | Status: DC
Start: 2022-08-08 — End: 2023-07-17
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
## 2022-08-08 NOTE — Assessment & Plan Note (Signed)
Reviewed health habits including diet and exercise and skin cancer prevention Reviewed appropriate screening tests for age  Also reviewed health mt list, fam hx and immunization status , as well as social and family history   See HPI Labs reviewed  Plans flu shot in the fall  Plans to get shingrix if covered by insurance  Mammogram scheduled next mo  cologuard ordered for colon screening  Enc good self care and healthy diet/exercise

## 2022-08-08 NOTE — Progress Notes (Signed)
   Subjective:    Patient ID: Ariel Compton, female    DOB: 12-26-1955, 66 y.o.   MRN: [id number]  HPI    Review of Systems     Objective:   Physical Exam        Assessment & Plan:

## 2022-08-08 NOTE — Assessment & Plan Note (Signed)
Overall stable Disc goals for lipids and reasons to control them Rev last labs with pt Rev low sat fat diet in detail Ratio of 3

## 2022-08-08 NOTE — Patient Instructions (Addendum)
If you are interested in the new shingles vaccine (Shingrix) - call your local pharmacy to check on coverage and availability  If affordable, get on a wait list at your pharmacy to get the vaccine.  Do the cologuard test for colon cancer screening   If you have any problems let me know   Take care of yourself

## 2022-08-08 NOTE — Assessment & Plan Note (Addendum)
Taking meloxicam and methocarbamol Wearing brace Still some pain -dealing with it  Wants to return to work today-note done  She does not heavy lift   She will need LS film about a month out from injury to document healing
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
## 2022-08-08 NOTE — Assessment & Plan Note (Signed)
Not ready to do a colonoscopy  cologuard discussed and ordered
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
## 2022-08-08 NOTE — Assessment & Plan Note (Signed)
Glucose of 105 fasting  Will check a1c next time  disc imp of low glycemic diet and wt loss to prevent DM2

## 2022-08-09 ENCOUNTER — Encounter: Payer: Self-pay | Admitting: Family Medicine

## 2022-08-09 DIAGNOSIS — S32019S Unspecified fracture of first lumbar vertebra, sequela: Secondary | ICD-10-CM

## 2022-08-16 ENCOUNTER — Other Ambulatory Visit: Payer: Self-pay | Admitting: Family Medicine

## 2022-08-16 ENCOUNTER — Encounter: Payer: Self-pay | Admitting: Family Medicine

## 2022-08-18 ENCOUNTER — Telehealth: Payer: Self-pay | Admitting: *Deleted

## 2022-08-18 NOTE — Telephone Encounter (Signed)
Left VM requesting pt to call the office back 
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
## 2022-08-18 NOTE — Telephone Encounter (Signed)
Pt notified and will stop by on Monday to get xray

## 2022-08-22 ENCOUNTER — Ambulatory Visit (INDEPENDENT_AMBULATORY_CARE_PROVIDER_SITE_OTHER)
Admission: RE | Admit: 2022-08-22 | Discharge: 2022-08-22 | Disposition: A | Discharge status: Home / Self Care | Payer: Federal, State, Local not specified - PPO | Source: Ambulatory Visit | Attending: Family Medicine | Admitting: Family Medicine

## 2022-08-22 DIAGNOSIS — M545 Low back pain, unspecified: Secondary | ICD-10-CM | POA: Diagnosis not present

## 2022-08-22 DIAGNOSIS — S32019S Unspecified fracture of first lumbar vertebra, sequela: Secondary | ICD-10-CM | POA: Diagnosis not present

## 2022-08-25 ENCOUNTER — Encounter: Payer: Self-pay | Admitting: Family Medicine

## 2022-08-25 DIAGNOSIS — S32019S Unspecified fracture of first lumbar vertebra, sequela: Secondary | ICD-10-CM

## 2022-08-25 DIAGNOSIS — E2839 Other primary ovarian failure: Secondary | ICD-10-CM | POA: Insufficient documentation

## 2022-08-29 ENCOUNTER — Other Ambulatory Visit: Payer: Self-pay | Admitting: Family Medicine

## 2022-09-10 ENCOUNTER — Encounter: Payer: Self-pay | Admitting: Family Medicine

## 2022-09-12 MED ORDER — METHOCARBAMOL 500 MG PO TABS: 500.0000 mg | ORAL_TABLET | Freq: Three times a day (TID) | ORAL | 3 refills | Status: DC | PRN

## 2022-10-03 ENCOUNTER — Ambulatory Visit
Admission: RE | Admit: 2022-10-03 | Discharge: 2022-10-03 | Disposition: A | Discharge status: Home / Self Care | Payer: Federal, State, Local not specified - PPO | Source: Ambulatory Visit | Attending: Family Medicine | Admitting: Family Medicine

## 2022-10-03 DIAGNOSIS — Z1231 Encounter for screening mammogram for malignant neoplasm of breast: Secondary | ICD-10-CM | POA: Insufficient documentation

## 2022-12-01 ENCOUNTER — Other Ambulatory Visit: Payer: Self-pay | Admitting: Family Medicine

## 2022-12-02 MED ORDER — ZOLPIDEM TARTRATE 10 MG PO TABS: 10.0000 mg | ORAL_TABLET | Freq: Every evening | ORAL | 0 refills | Status: DC | PRN

## 2022-12-02 NOTE — Telephone Encounter (Signed)
Last filled 08-08-22 #30 Last OV/CPE 08-08-22 No Future OV [facility]

## 2022-12-18 ENCOUNTER — Other Ambulatory Visit: Payer: Self-pay | Admitting: Family Medicine

## 2022-12-20 NOTE — Telephone Encounter (Signed)
Last filled on 06/28/22 #270 tabs with 1 refill, CPE on 08/08/22

## 2022-12-27 ENCOUNTER — Other Ambulatory Visit: Payer: Self-pay | Admitting: Family Medicine

## 2022-12-27 NOTE — Telephone Encounter (Signed)
Last filled on 07/11/22 #30 tabs/ 5 refills. CPE scheduled 08/16/23

## 2023-02-28 ENCOUNTER — Other Ambulatory Visit: Payer: Self-pay | Admitting: Family Medicine

## 2023-03-01 MED ORDER — ZOLPIDEM TARTRATE 10 MG PO TABS: 10.0000 mg | ORAL_TABLET | Freq: Every evening | ORAL | 0 refills | Status: DC | PRN

## 2023-03-01 NOTE — Telephone Encounter (Signed)
Name of Medication: Ambien Name of Pharmacy: [facility] or Written Date and Quantity: 12/02/22 #30 tabs/ 0 refills Last Office Visit and Type: 08/08/22 CPE Next Office Visit and Type: 08/16/23 CPE

## 2023-04-16 ENCOUNTER — Encounter: Payer: Self-pay | Admitting: Family Medicine

## 2023-04-17 MED ORDER — METHOCARBAMOL 500 MG PO TABS: 500.0000 mg | ORAL_TABLET | Freq: Three times a day (TID) | ORAL | 3 refills | Status: AC | PRN

## 2023-05-29 ENCOUNTER — Encounter: Payer: Self-pay | Admitting: Family Medicine

## 2023-05-29 ENCOUNTER — Other Ambulatory Visit: Payer: Self-pay | Admitting: Family Medicine

## 2023-06-09 ENCOUNTER — Other Ambulatory Visit: Payer: Self-pay | Admitting: Family Medicine

## 2023-06-12 MED ORDER — ZOLPIDEM TARTRATE 10 MG PO TABS: 10.0000 mg | ORAL_TABLET | Freq: Every evening | ORAL | 0 refills | Status: DC | PRN

## 2023-06-12 NOTE — Telephone Encounter (Signed)
Name of Medication: Ambien Name of Pharmacy: [facility] Last Fill or Written Date and Quantity: 03/01/23 #30 tabs/ 0 refills Last Office Visit and Type: 08/08/22 CPE Next Office Visit and Type: 08/16/23 CPE

## 2023-06-17 ENCOUNTER — Other Ambulatory Visit: Payer: Self-pay | Admitting: Family Medicine

## 2023-06-18 ENCOUNTER — Encounter: Payer: Self-pay | Admitting: Family Medicine

## 2023-06-19 NOTE — Telephone Encounter (Signed)
Last filled on 12/20/22 #270 tabs with 1 refill, CPE on 08/16/23

## 2023-07-11 ENCOUNTER — Other Ambulatory Visit: Payer: Self-pay | Admitting: Family Medicine

## 2023-07-11 DIAGNOSIS — Z1231 Encounter for screening mammogram for malignant neoplasm of breast: Secondary | ICD-10-CM

## 2023-07-17 ENCOUNTER — Encounter: Payer: Self-pay | Admitting: Family Medicine

## 2023-07-17 MED ORDER — CITALOPRAM HYDROBROMIDE 20 MG PO TABS: ORAL_TABLET | ORAL | 0 refills | Status: DC

## 2023-07-17 MED ORDER — ESTRADIOL 0.5 MG PO TABS: ORAL_TABLET | ORAL | 0 refills | Status: DC

## 2023-07-27 ENCOUNTER — Encounter (INDEPENDENT_AMBULATORY_CARE_PROVIDER_SITE_OTHER): Payer: Self-pay

## 2023-08-06 ENCOUNTER — Telehealth: Payer: Self-pay | Admitting: Family Medicine

## 2023-08-06 DIAGNOSIS — R7309 Other abnormal glucose: Secondary | ICD-10-CM

## 2023-08-06 DIAGNOSIS — Z0000 Encounter for general adult medical examination without abnormal findings: Secondary | ICD-10-CM

## 2023-08-06 DIAGNOSIS — E782 Mixed hyperlipidemia: Secondary | ICD-10-CM

## 2023-08-06 NOTE — Telephone Encounter (Signed)
-----   Message from Alvina Chou sent at 07/27/2023  9:19 AM EDT ----- Regarding: Lab orders for Tuesday, 8.27.24 Patient is scheduled for CPX labs, please order future labs, Thanks , Camelia Eng

## 2023-08-08 ENCOUNTER — Other Ambulatory Visit (INDEPENDENT_AMBULATORY_CARE_PROVIDER_SITE_OTHER): Payer: Federal, State, Local not specified - PPO

## 2023-08-08 DIAGNOSIS — R739 Hyperglycemia, unspecified: Secondary | ICD-10-CM

## 2023-08-08 DIAGNOSIS — E782 Mixed hyperlipidemia: Secondary | ICD-10-CM | POA: Diagnosis not present

## 2023-08-08 DIAGNOSIS — R7309 Other abnormal glucose: Secondary | ICD-10-CM | POA: Diagnosis not present

## 2023-08-08 DIAGNOSIS — Z0000 Encounter for general adult medical examination without abnormal findings: Secondary | ICD-10-CM | POA: Diagnosis not present

## 2023-08-08 LAB — LIPID PANEL
Cholesterol: 207 mg/dL — ABNORMAL HIGH (ref 0–200)
HDL: 54.8 mg/dL (ref >39.00)
LDL Cholesterol: 124 mg/dL — ABNORMAL HIGH (ref 0–99)
NonHDL: 152.07
Total CHOL/HDL Ratio: 4
Triglycerides: 142 mg/dL (ref 0.0–149.0)
VLDL: 28.4 mg/dL (ref 0.0–40.0)

## 2023-08-08 LAB — CBC WITH DIFFERENTIAL/PLATELET
Basophils Absolute: 0.1 10*3/uL (ref 0.0–0.1)
Basophils Relative: 1 % (ref 0.0–3.0)
Eosinophils Absolute: 0.2 10*3/uL (ref 0.0–0.7)
Eosinophils Relative: 4.4 % (ref 0.0–5.0)
HCT: 41 % (ref 36.0–46.0)
Hemoglobin: 13.5 g/dL (ref 12.0–15.0)
Lymphocytes Relative: 30.4 % (ref 12.0–46.0)
Lymphs Abs: 1.6 10*3/uL (ref 0.7–4.0)
MCHC: 32.8 g/dL (ref 30.0–36.0)
MCV: 90.9 fl (ref 78.0–100.0)
Monocytes Absolute: 0.3 10*3/uL (ref 0.1–1.0)
Monocytes Relative: 6.1 % (ref 3.0–12.0)
Neutro Abs: 3.1 10*3/uL (ref 1.4–7.7)
Neutrophils Relative %: 58.1 % (ref 43.0–77.0)
Platelets: 285 10*3/uL (ref 150.0–400.0)
RBC: 4.51 Mil/uL (ref 3.87–5.11)
RDW: 14.1 % (ref 11.5–15.5)
WBC: 5.3 10*3/uL (ref 4.0–10.5)

## 2023-08-08 LAB — COMPREHENSIVE METABOLIC PANEL
ALT: 9 U/L (ref 0–35)
AST: 17 U/L (ref 0–37)
Albumin: 3.8 g/dL (ref 3.5–5.2)
Alkaline Phosphatase: 46 U/L (ref 39–117)
BUN: 15 mg/dL (ref 6–23)
CO2: 31 mEq/L (ref 19–32)
Calcium: 9.2 mg/dL (ref 8.4–10.5)
Chloride: 103 mEq/L (ref 96–112)
Creatinine, Ser: 0.68 mg/dL (ref 0.40–1.20)
GFR: 91.22 mL/min (ref >60.00)
Glucose, Bld: 94 mg/dL (ref 70–99)
Potassium: 4.7 mEq/L (ref 3.5–5.1)
Sodium: 140 mEq/L (ref 135–145)
Total Bilirubin: 0.3 mg/dL (ref 0.2–1.2)
Total Protein: 6.5 g/dL (ref 6.0–8.3)

## 2023-08-08 LAB — HEMOGLOBIN A1C: Hgb A1c MFr Bld: 5.8 % (ref 4.6–6.5)

## 2023-08-08 LAB — TSH: TSH: 1.65 u[IU]/mL (ref 0.35–5.50)

## 2023-08-16 ENCOUNTER — Ambulatory Visit (INDEPENDENT_AMBULATORY_CARE_PROVIDER_SITE_OTHER): Payer: Federal, State, Local not specified - PPO | Admitting: Family Medicine

## 2023-08-16 ENCOUNTER — Encounter: Payer: Self-pay | Admitting: Family Medicine

## 2023-08-16 VITALS — BP 118/84 | HR 65 | Temp 97.3°F | Ht 61.75 in | Wt 182.2 lb

## 2023-08-16 DIAGNOSIS — Z23 Encounter for immunization: Secondary | ICD-10-CM

## 2023-08-16 DIAGNOSIS — R7303 Prediabetes: Secondary | ICD-10-CM

## 2023-08-16 DIAGNOSIS — F341 Dysthymic disorder: Secondary | ICD-10-CM | POA: Diagnosis not present

## 2023-08-16 DIAGNOSIS — Z0000 Encounter for general adult medical examination without abnormal findings: Secondary | ICD-10-CM

## 2023-08-16 DIAGNOSIS — E782 Mixed hyperlipidemia: Secondary | ICD-10-CM

## 2023-08-16 DIAGNOSIS — Z1211 Encounter for screening for malignant neoplasm of colon: Secondary | ICD-10-CM

## 2023-08-16 DIAGNOSIS — Z8781 Personal history of (healed) traumatic fracture: Secondary | ICD-10-CM

## 2023-08-16 DIAGNOSIS — E2839 Other primary ovarian failure: Secondary | ICD-10-CM

## 2023-08-16 DIAGNOSIS — Z6833 Body mass index (BMI) 33.0-33.9, adult: Secondary | ICD-10-CM

## 2023-08-16 DIAGNOSIS — Z79890 Hormone replacement therapy (postmenopausal): Secondary | ICD-10-CM

## 2023-08-16 DIAGNOSIS — M5481 Occipital neuralgia: Secondary | ICD-10-CM

## 2023-08-16 DIAGNOSIS — E6609 Other obesity due to excess calories: Secondary | ICD-10-CM

## 2023-08-16 NOTE — Assessment & Plan Note (Signed)
Disc goals for lipids and reasons to control them Rev last labs with pt Rev low sat fat diet in detail LDL up from 103 to 124- will watch this  Diet sounds fairly good/ this may be genetic  If increase consider statin in future

## 2023-08-16 NOTE — Assessment & Plan Note (Signed)
Dexa ordered  Doing well

## 2023-08-16 NOTE — Assessment & Plan Note (Signed)
Lab Results  Component Value Date   HGBA1C 5.8 08/08/2023   disc imp of low glycemic diet and wt loss to prevent DM2

## 2023-08-16 NOTE — Assessment & Plan Note (Signed)
 Discussed how this problem influences overall health and the risks it imposes  Reviewed plan for weight loss with lower calorie diet (via better food choices (lower glycemic and portion control) along with exercise building up to or more than 30 minutes 5 days per week including some aerobic activity and strength training

## 2023-08-16 NOTE — Assessment & Plan Note (Signed)
Reviewed health habits including diet and exercise and skin cancer prevention Reviewed appropriate screening tests for age  Also reviewed health mt list, fam hx and immunization status , as well as social and family history   See HPI Labs reviewed and ordered Flu shot today  Prevnar 20 vaccine today  In past covid shot caused bruising (all over) Interested in shingrix and plans to check on coverage Mammogram planned for oct  Dexa ordered-pt will schedule that as well Cologuard ordered for colon cancer screening  Bone health discussed /encouraged to get at least 2000 international units vit D3 daily  PHq 0 with current medicines Encouraged to add strength building exercise

## 2023-08-16 NOTE — Assessment & Plan Note (Signed)
Pt wants to continue estradiol 0.5 mg daily for brain health and menopause symptoms and bone health  She feels good with this Aware of risks (breast cancer/ blood clots) - voices understanding and wants to continue

## 2023-08-16 NOTE — Patient Instructions (Addendum)
If you are interested in the shingles vaccine series (Shingrix), call your insurance or pharmacy to check on coverage and location it must be given.  If affordable - you can schedule it here or at your pharmacy depending on coverage   Make sure you get some calcium and at least 2000 international units of vitamin D daily   Keep walking  Add some strength training to your routine, this is important for bone and brain health and can reduce your risk of falls and help your body use insulin properly and regulate weight  Light weights, exercise bands , and internet videos are a good way to start  Yoga (chair or regular), machines , floor exercises or a gym with machines are also good options    Try to get most of your carbohydrates from produce (with the exception of white potatoes) and whole grains Eat less bread/pasta/rice/snack foods/cereals/sweets and other items from the middle of the grocery store (processed carbs)  Cholesterol  Avoid red meat/ fried foods/ egg yolks/ fatty breakfast meats/ butter, cheese and high fat dairy/ and shellfish       Call to schedule mammogram and DEXA  You have an order for:  []   2D Mammogram  [x]   3D Mammogram  [x]   Bone Density     Please call for appointment:   [x]   [facility]  [address] [place] [address]  [phone]  []   [facility])   [address]  [place], [place] [address]  [phone]  []   [facility]      [address] [place], [place]        [phone]         []   [facility]  [address] [place], [place]  [phone]  []  [facility]   [address]   [place], [place] [address]  [phone]  []  [facility]  [address] [place], [place] [address] [phone]    Make sure to wear two piece clothing  No lotions powders or deodorants the day of the appointment Make sure to bring picture ID and insurance card.  Bring list of medications you are currently taking including any supplements.   Schedule your screening mammogram through MyChart!   [place] imaging sites can now be scheduled through MyChart.  Log into your MyChart account.  Go to 'Visit' (or 'Appointments' if  on mobile App) --> Schedule an  Appointment  Under 'Select a Reason for Visit' choose the Mammogram  Screening option.  Complete the pre-visit questions  and select the time and place that  best fits your schedule

## 2023-08-16 NOTE — Assessment & Plan Note (Signed)
Cologuard ordered-promised to do it this time (was moving last year)

## 2023-08-16 NOTE — Assessment & Plan Note (Signed)
Has dexa ordered for [place] -plans to schedule

## 2023-08-16 NOTE — Progress Notes (Signed)
Subjective:    Patient ID: Ariel Compton, female    DOB: [birth year], 67 y.o.   MRN: [id number]  HPI  Here for health maintenance exam and to review chronic medical problems  Not using medicare currently   Wt Readings from Last 3 Encounters:  08/16/23 182 lb 3.2 oz (82.6 kg)  08/08/22 168 lb 4 oz (76.3 kg)  08/02/22 168 lb 6.4 oz (76.4 kg)   33.60 kg/m  Vitals:   08/16/23 0846  BP: 118/84  Pulse: 65  Temp: (!) 97.3 F (36.3 C)  SpO2: 96%    Immunization History  Administered Date(s) Administered   Fluad Trivalent(High Dose 65+) 08/16/2023   Influenza Whole 09/12/2007, 09/07/2009, 09/10/2013   Influenza,inj,Quad PF,6+ Mos 08/13/2017, 08/20/2018   Influenza-Unspecified 09/12/2014, 08/26/2016, 09/12/2019, 07/26/2021   PFIZER(Purple Top)SARS-COV-2 Vaccination 02/22/2020, 03/17/2020, 09/24/2020   Pneumococcal Polysaccharide-23 01/22/2013   Td 07/25/1997, 01/15/2008, 08/27/2009   Tdap 07/30/2019    Health Maintenance Due  Topic Date Due   Zoster Vaccines- Shingrix (1 of 2) Never done   Pneumonia Vaccine 6+ Years old (2 of 2 - PCV) 11/27/2022   DEXA SCAN  Never done   COVID-19 Vaccine (4 - 2023-24 season) 08/13/2023   Feeling ok  Taking care of herself    Prevnar 20 vaccine -interested today   Had flu shot today -high dose   Shingrix-interested   Covid vaccine gave her many bruises last time / ? If affected platelets   Mammogram 09/2022 Has one scheduled in oct  Self breast exam: -no lumps   Gyn health Hysterectomy Takes estrace 0.5 mg daily    Colon cancer screening  We ordered cologard last year- was moving and unable to do  Wants to do that   Bone health  Dexa was ordered at [facility]: none recent  Fractures-past vertebral fracture  Supplements : takes mvi  Exercise :  walking (at work) and uses stairs Sits on ball at work    Mood    08/16/2023    8:48 AM 08/05/2021    9:53 AM 08/04/2020    9:33 AM 07/30/2019    9:52 AM 07/25/2018     8:15 AM  Depression screen PHQ 2/9  Decreased Interest 0 0 1 0 0  Down, Depressed, Hopeless 0 0 1 0 0  PHQ - 2 Score 0 0 2 0 0  Altered sleeping 0 2 3 1  0  Tired, decreased energy 0 1 3 0 0  Change in appetite 0 0 0 0 0  Feeling bad or failure about yourself  0 0 0 0 0  Trouble concentrating 0 0 2 1   Moving slowly or fidgety/restless 0 0 0 0 0  Suicidal thoughts 0 0 0 0 0  PHQ-9 Score 0 3 10 2  0  Difficult doing work/chores Not difficult at all Somewhat difficult  Not difficult at all Not difficult at all   Celexa 20 mg daily  Ambien prn sleep    Gabapentin for occipital neuralgia   Glucose control Lab Results  Component Value Date   HGBA1C 5.8 08/08/2023    Hyperlipidemai Lab Results  Component Value Date   CHOL 207 (H) 08/08/2023   CHOL 181 08/01/2022   CHOL 193 07/29/2021   Lab Results  Component Value Date   HDL 54.80 08/08/2023   HDL 56.70 08/01/2022   HDL 68.30 07/29/2021   Lab Results  Component Value Date   LDLCALC 124 (H) 08/08/2023   LDLCALC 103 (H) 08/01/2022  LDLCALC 108 (H) 07/29/2021   Lab Results  Component Value Date   TRIG 142.0 08/08/2023   TRIG 107.0 08/01/2022   TRIG 84.0 07/29/2021   Lab Results  Component Value Date   CHOLHDL 4 08/08/2023   CHOLHDL 3 08/01/2022   CHOLHDL 3 07/29/2021   Lab Results  Component Value Date   LDLDIRECT 114.0 01/15/2013   LDLDIRECT 121.4 10/25/2007   LDLDIRECT 121.4 10/23/2007   Diet controlled  Eating more butter  No fatty meats     Other labs Lab Results  Component Value Date   NA 140 08/08/2023   K 4.7 08/08/2023   CO2 31 08/08/2023   GLUCOSE 94 08/08/2023   BUN 15 08/08/2023   CREATININE 0.68 08/08/2023   CALCIUM 9.2 08/08/2023   GFR 91.22 08/08/2023   GFRNONAA >60 05/17/2021   Lab Results  Component Value Date   ALT 9 08/08/2023   AST 17 08/08/2023   ALKPHOS 46 08/08/2023   BILITOT 0.3 08/08/2023   Lab Results  Component Value Date   TSH 1.65 08/08/2023   Lab  Results  Component Value Date   WBC 5.3 08/08/2023   HGB 13.5 08/08/2023   HCT 41.0 08/08/2023   MCV 90.9 08/08/2023   PLT 285.0 08/08/2023      Patient Active Problem List   Diagnosis Date Noted   Estrogen deficiency 08/25/2022   Prediabetes 08/08/2022   History of vertebral fracture 08/02/2022   Left leg pain 07/26/2018   Occipital neuralgia of right side 02/07/2017   Colon cancer screening 01/22/2013   Routine general medical examination at a health care facility 11/11/2012   Postmenopausal HRT (hormone replacement therapy) 07/07/2011   Trigger middle finger of right hand 03/16/2010   MENOPAUSE, SURGICAL 09/04/2009   Asthma 08/11/2008   HYPERLIPIDEMIA 12/04/2007   Depression 12/04/2007   Allergic rhinitis 12/04/2007   Disorder of bladder 12/04/2007   Obesity 10/22/2007   Past Medical History:  Diagnosis Date   Allergic rhinitis    Asthma    mild   Contracture of palmar fascia    Depression    Headache    Mixed hyperlipidemia    Obesity, unspecified    Plantar fasciitis    Symptomatic states associated with artificial menopause    Unspecified disorder of bladder    Past Surgical History:  Procedure Laterality Date   ANKLE FRACTURE SURGERY Right    VAGINAL HYSTERECTOMY  2008   Enterocele repair   Social History   Tobacco Use   Smoking status: Never   Smokeless tobacco: Never  Vaping Use   Vaping status: Never Used  Substance Use Topics   Alcohol use: No    Alcohol/week: 0.0 standard drinks of alcohol   Drug use: No   Family History  Problem Relation Age of Onset   Peripheral vascular disease Father        Smoker   Prostate cancer Father    Heart failure Father    Other Father        Heart "problems"   Stroke Mother    Headache Neg Hx    Breast cancer Neg Hx    Allergies  Allergen Reactions   Prednisone Other (See Comments)    Chills, cough Makes hyper   Current Outpatient Medications on File Prior to Visit  Medication Sig Dispense  Refill   albuterol (VENTOLIN HFA) 108 (90 Base) MCG/ACT inhaler Inhale 2 puffs into the lungs every 4 (four) hours as needed for wheezing or shortness of breath (  or cough). 8 g 11   citalopram (CELEXA) 20 MG tablet TAKE 1 TABLET(20 MG) BY MOUTH DAILY 90 tablet 0   estradiol (ESTRACE) 0.5 MG tablet TAKE 1 TABLET(0.5 MG) BY MOUTH DAILY 90 tablet 0   gabapentin (NEURONTIN) 600 MG tablet TAKE 1 TABLET(600 MG) BY MOUTH THREE TIMES DAILY 270 tablet 1   ipratropium (ATROVENT) 0.06 % nasal spray Place 2 sprays into both nostrils 4 (four) times daily. 15 mL 12   methocarbamol (ROBAXIN) 500 MG tablet Take 1 tablet (500 mg total) by mouth every 8 (eight) hours as needed for muscle spasms (back pain). Caution of sedation 30 tablet 3   Multiple Vitamin (MULTIVITAMIN) tablet Take 1 tablet by mouth daily.     zolpidem (AMBIEN) 10 MG tablet Take 1 tablet (10 mg total) by mouth at bedtime as needed for sleep (during travel). 30 tablet 0   No current facility-administered medications on file prior to visit.    Review of Systems  Constitutional:  Positive for fatigue. Negative for activity change, appetite change, fever and unexpected weight change.  HENT:  Negative for congestion, ear pain, rhinorrhea, sinus pressure and sore throat.   Eyes:  Negative for pain, redness and visual disturbance.  Respiratory:  Negative for cough, shortness of breath and wheezing.   Cardiovascular:  Negative for chest pain and palpitations.  Gastrointestinal:  Negative for abdominal pain, blood in stool, constipation and diarrhea.  Endocrine: Negative for polydipsia and polyuria.  Genitourinary:  Negative for dysuria, frequency and urgency.  Musculoskeletal:  Negative for arthralgias, back pain and myalgias.  Skin:  Negative for pallor and rash.  Allergic/Immunologic: Negative for environmental allergies.  Neurological:  Negative for dizziness, syncope and headaches.  Hematological:  Negative for adenopathy. Does not  bruise/bleed easily.  Psychiatric/Behavioral:  Negative for decreased concentration and dysphoric mood. The patient is not nervous/anxious.        Objective:   Physical Exam Constitutional:      General: She is not in acute distress.    Appearance: Normal appearance. She is well-developed. She is obese. She is not ill-appearing or diaphoretic.  HENT:     Head: Normocephalic and atraumatic.     Right Ear: Tympanic membrane, ear canal and external ear normal.     Left Ear: Tympanic membrane, ear canal and external ear normal.     Nose: Nose normal. No congestion.     Mouth/Throat:     Mouth: Mucous membranes are moist.     Pharynx: Oropharynx is clear. No posterior oropharyngeal erythema.  Eyes:     General: No scleral icterus.    Extraocular Movements: Extraocular movements intact.     Conjunctiva/sclera: Conjunctivae normal.     Pupils: Pupils are equal, round, and reactive to light.  Neck:     Thyroid: No thyromegaly.     Vascular: No carotid bruit or JVD.  Cardiovascular:     Rate and Rhythm: Normal rate and regular rhythm.     Pulses: Normal pulses.     Heart sounds: Normal heart sounds.     No gallop.  Pulmonary:     Effort: Pulmonary effort is normal. No respiratory distress.     Breath sounds: Normal breath sounds. No wheezing.     Comments: Good air exch Chest:     Chest wall: No tenderness.  Abdominal:     General: Bowel sounds are normal. There is no distension or abdominal bruit.     Palpations: Abdomen is soft. There is no mass.  Tenderness: There is no abdominal tenderness.     Hernia: No hernia is present.  Genitourinary:    Comments: Breast exam: No mass, nodules, thickening, tenderness, bulging, retraction, inflamation, nipple discharge or skin changes noted.  No axillary or clavicular LA.     Musculoskeletal:        General: No tenderness. Normal range of motion.     Cervical back: Normal range of motion and neck supple. No rigidity. No muscular  tenderness.     Right lower leg: No edema.     Left lower leg: No edema.     Comments: No kyphosis   Lymphadenopathy:     Cervical: No cervical adenopathy.  Skin:    General: Skin is warm and dry.     Coloration: Skin is not pale.     Findings: No erythema or rash.     Comments: Solar lentigines diffusely   Neurological:     Mental Status: She is alert. Mental status is at baseline.     Cranial Nerves: No cranial nerve deficit.     Motor: No abnormal muscle tone.     Coordination: Coordination normal.     Gait: Gait normal.     Deep Tendon Reflexes: Reflexes are normal and symmetric. Reflexes normal.  Psychiatric:        Mood and Affect: Mood normal.        Cognition and Memory: Cognition and memory normal.           Assessment & Plan:   Problem List Items Addressed This Visit       Nervous and Auditory   Occipital neuralgia of right side    Gabapentin 600 mg tid         Other   Colon cancer screening    Cologuard ordered-promised to do it this time (was moving last year)      Relevant Orders   Cologuard   Depression    Doing well with celexa 20mg  daily - wants to continue Thinks that the estrace helps as well Ambien for sleep   Encouraged self care/exercise  Will continue to monitor        Estrogen deficiency    Has dexa ordered for [place] -plans to schedule      History of vertebral fracture    Dexa ordered  Doing well       HYPERLIPIDEMIA    Disc goals for lipids and reasons to control them Rev last labs with pt Rev low sat fat diet in detail LDL up from 103 to 124- will watch this  Diet sounds fairly good/ this may be genetic  If increase consider statin in future       Obesity    Discussed how this problem influences overall health and the risks it imposes  Reviewed plan for weight loss with lower calorie diet (via better food choices (lower glycemic and portion control) along with exercise building up to or more than 30 minutes 5  days per week including some aerobic activity and strength training         Postmenopausal HRT (hormone replacement therapy)    Pt wants to continue estradiol 0.5 mg daily for brain health and menopause symptoms and bone health  She feels good with this Aware of risks (breast cancer/ blood clots) - voices understanding and wants to continue       Prediabetes    Lab Results  Component Value Date   HGBA1C 5.8 08/08/2023   disc imp of  low glycemic diet and wt loss to prevent DM2       Routine general medical examination at a health care facility - Primary    Reviewed health habits including diet and exercise and skin cancer prevention Reviewed appropriate screening tests for age  Also reviewed health mt list, fam hx and immunization status , as well as social and family history   See HPI Labs reviewed and ordered Flu shot today  Prevnar 20 vaccine today  In past covid shot caused bruising (all over) Interested in shingrix and plans to check on coverage Mammogram planned for oct  Dexa ordered-pt will schedule that as well Cologuard ordered for colon cancer screening  Bone health discussed /encouraged to get at least 2000 international units vit D3 daily  PHq 0 with current medicines Encouraged to add strength building exercise       Other Visit Diagnoses     Encounter for immunization       Relevant Orders   Flu Vaccine Trivalent High Dose (Fluad) (Completed)

## 2023-08-16 NOTE — Assessment & Plan Note (Signed)
Doing well with celexa 20mg  daily - wants to continue Thinks that the estrace helps as well Ambien for sleep   Encouraged self care/exercise  Will continue to monitor

## 2023-08-16 NOTE — Assessment & Plan Note (Signed)
Gabapentin 600 mg tid

## 2023-08-21 ENCOUNTER — Encounter: Payer: Self-pay | Admitting: Family Medicine

## 2023-08-21 DIAGNOSIS — E2839 Other primary ovarian failure: Secondary | ICD-10-CM

## 2023-10-04 ENCOUNTER — Other Ambulatory Visit: Payer: Self-pay | Admitting: Family Medicine

## 2023-10-04 DIAGNOSIS — F341 Dysthymic disorder: Secondary | ICD-10-CM

## 2023-10-04 DIAGNOSIS — Z79890 Hormone replacement therapy (postmenopausal): Secondary | ICD-10-CM

## 2023-10-05 MED ORDER — ESTRADIOL 0.5 MG PO TABS: ORAL_TABLET | ORAL | 2 refills | Status: DC

## 2023-10-05 MED ORDER — CITALOPRAM HYDROBROMIDE 20 MG PO TABS: ORAL_TABLET | ORAL | 3 refills | Status: DC

## 2023-10-05 NOTE — Telephone Encounter (Signed)
E-scribed refill 

## 2023-10-05 NOTE — Telephone Encounter (Signed)
Estradiol Last rx:  07/17/23, #90 Last OV:  08/16/23, CPE Next OV:  none

## 2023-10-16 DIAGNOSIS — H52229 Regular astigmatism, unspecified eye: Secondary | ICD-10-CM | POA: Diagnosis not present

## 2023-10-16 DIAGNOSIS — H2513 Age-related nuclear cataract, bilateral: Secondary | ICD-10-CM | POA: Diagnosis not present

## 2023-10-16 DIAGNOSIS — H524 Presbyopia: Secondary | ICD-10-CM | POA: Diagnosis not present

## 2023-10-16 DIAGNOSIS — H5210 Myopia, unspecified eye: Secondary | ICD-10-CM | POA: Diagnosis not present

## 2023-11-03 ENCOUNTER — Other Ambulatory Visit: Payer: Self-pay | Admitting: Family Medicine

## 2023-11-03 NOTE — Telephone Encounter (Signed)
Name of Medication: Ambien Name of Pharmacy: [facility] Last Fill or Written Date and Quantity: 06/12/23 #30 tabs/ 0 refills Last Office Visit and Type: 08/16/23 CPE Next Office Visit and Type: none scheduled

## 2023-11-05 MED ORDER — ZOLPIDEM TARTRATE 10 MG PO TABS: 10.0000 mg | ORAL_TABLET | Freq: Every evening | ORAL | 0 refills | Status: DC | PRN

## 2023-11-08 ENCOUNTER — Other Ambulatory Visit: Payer: Federal, State, Local not specified - PPO

## 2023-11-21 ENCOUNTER — Other Ambulatory Visit: Payer: Self-pay | Admitting: Family Medicine

## 2023-12-27 ENCOUNTER — Encounter: Payer: Self-pay | Admitting: Family Medicine

## 2023-12-27 MED ORDER — GABAPENTIN 600 MG PO TABS: 600.0000 mg | ORAL_TABLET | Freq: Three times a day (TID) | ORAL | 1 refills | Status: DC

## 2023-12-27 NOTE — Telephone Encounter (Signed)
 Last filled on 06/19/23 # 270 tabs/ 1 refill   CPE was on 08/16/23

## 2024-01-20 ENCOUNTER — Ambulatory Visit
Admission: EM | Admit: 2024-01-20 | Discharge: 2024-01-20 | Disposition: A | Discharge status: Home / Self Care | Payer: Federal, State, Local not specified - PPO | Attending: Emergency Medicine | Admitting: Emergency Medicine

## 2024-01-20 DIAGNOSIS — J069 Acute upper respiratory infection, unspecified: Secondary | ICD-10-CM

## 2024-01-20 LAB — POCT RAPID STREP A (OFFICE): Rapid Strep A Screen: NEGATIVE

## 2024-01-20 MED ORDER — AZITHROMYCIN 250 MG PO TABS: 250.0000 mg | ORAL_TABLET | Freq: Every day | ORAL | 0 refills | Status: DC

## 2024-01-20 NOTE — Discharge Instructions (Signed)
 Take the Zithromax as directed.  Follow up with your primary care provider if your symptoms are not improving.

## 2024-01-20 NOTE — ED Triage Notes (Addendum)
 Patient to Urgent Care with complaints of sore throat/ fevers/ chills/ bilateral ear pain.  Reports symptoms started approx 10 days ago. Family members sick w/ strep.   Meds: aspirin/ dayquil

## 2024-01-20 NOTE — ED Provider Notes (Signed)
 Ariel Compton    CSN: [id number] Arrival date & time: 01/20/24  0805      History   Chief Complaint Chief Complaint  Patient presents with   Fever    HPI Ariel Compton is a 68 y.o. female.  Patient presents with 10-day history of subjective fever, chills, ear pain, sore throat, mild cough.  She has been taking aspirin and DayQuil; last taken at 0400 this morning.  No wheezing, shortness of breath, vomiting, diarrhea, rash.  Her medical history includes asthma.  She has not needed to use her albuterol  inhaler.  The history is provided by the patient and medical records.    Past Medical History:  Diagnosis Date   Allergic rhinitis    Asthma    mild   Contracture of palmar fascia    Depression    Headache    Mixed hyperlipidemia    Obesity, unspecified    Plantar fasciitis    Symptomatic states associated with artificial menopause    Unspecified disorder of bladder     Patient Active Problem List   Diagnosis Date Noted   Estrogen deficiency 08/25/2022   Prediabetes 08/08/2022   History of vertebral fracture 08/02/2022   Left leg pain 07/26/2018   Occipital neuralgia of right side 02/07/2017   Colon cancer screening 01/22/2013   Routine general medical examination at a health care facility 11/11/2012   Postmenopausal HRT (hormone replacement therapy) 07/07/2011   Trigger middle finger of right hand 03/16/2010   MENOPAUSE, SURGICAL 09/04/2009   Asthma 08/11/2008   HYPERLIPIDEMIA 12/04/2007   Depression 12/04/2007   Allergic rhinitis 12/04/2007   Disorder of bladder 12/04/2007   Obesity 10/22/2007    Past Surgical History:  Procedure Laterality Date   ANKLE FRACTURE SURGERY Right    VAGINAL HYSTERECTOMY  2008   Enterocele repair    OB History   No obstetric history on file.      Home Medications    Prior to Admission medications   Medication Sig Start Date End Date Taking? Authorizing Provider  azithromycin  (ZITHROMAX ) 250 MG tablet Take 1  tablet (250 mg total) by mouth daily. Take first 2 tablets together, then 1 every day until finished. 01/20/24  Yes Corlis Burnard DEL, NP  albuterol  (VENTOLIN  HFA) 108 (90 Base) MCG/ACT inhaler Inhale 2 puffs into the lungs every 4 (four) hours as needed for wheezing or shortness of breath (or cough). 08/05/21   [name], Laine DELENA, MD  citalopram  (CELEXA ) 20 MG tablet TAKE 1 TABLET(20 MG) BY MOUTH DAILY 10/05/23   [name], Laine DELENA, MD  estradiol  (ESTRACE ) 0.5 MG tablet TAKE 1 TABLET(0.5 MG) BY MOUTH DAILY 10/05/23   [name], Laine DELENA, MD  gabapentin  (NEURONTIN ) 600 MG tablet Take 1 tablet (600 mg total) by mouth 3 (three) times daily. 12/27/23   [name], Laine DELENA, MD  ipratropium (ATROVENT ) 0.06 % nasal spray Place 2 sprays into both nostrils 4 (four) times daily. 10/06/21   Bernardino Ditch, NP  methocarbamol  (ROBAXIN ) 500 MG tablet Take 1 tablet (500 mg total) by mouth every 8 (eight) hours as needed for muscle spasms (back pain). Caution of sedation 04/17/23   [name], Laine DELENA, MD  Multiple Vitamin (MULTIVITAMIN) tablet Take 1 tablet by mouth daily.    [provider]  zolpidem  (AMBIEN ) 10 MG tablet Take 1 tablet (10 mg total) by mouth at bedtime as needed for sleep (during travel). 11/05/23   [name], Laine DELENA, MD    Family History Family History  Problem  Relation Age of Onset   Peripheral vascular disease Father        Smoker   Prostate cancer Father    Heart failure Father    Other Father        Heart problems   Stroke Mother    Headache Neg Hx    Breast cancer Neg Hx     Social History Social History   Tobacco Use   Smoking status: Never   Smokeless tobacco: Never  Vaping Use   Vaping status: Never Used  Substance Use Topics   Alcohol use: No    Alcohol/week: 0.0 standard drinks of alcohol   Drug use: No     Allergies   Prednisone    Review of Systems Review of Systems  Constitutional:  Positive for chills and fever.  HENT:  Positive for ear pain and sore throat.   Respiratory:   Positive for cough. Negative for shortness of breath.   Gastrointestinal:  Negative for diarrhea and vomiting.     Physical Exam Triage Vital Signs ED Triage Vitals [01/20/24 0827]  Encounter Vitals Group     BP 107/74     Systolic BP Percentile      Diastolic BP Percentile      Pulse Rate 84     Resp 18     Temp 97.7 F (36.5 C)     Temp src      SpO2 97 %     Weight      Height      Head Circumference      Peak Flow      Pain Score      Pain Loc      Pain Education      Exclude from Growth Chart    No data found.  Updated Vital Signs BP 107/74   Pulse 84   Temp 97.7 F (36.5 C)   Resp 18   SpO2 97%   Visual Acuity Right Eye Distance:   Left Eye Distance:   Bilateral Distance:    Right Eye Near:   Left Eye Near:    Bilateral Near:     Physical Exam Constitutional:      General: She is not in acute distress. HENT:     Right Ear: Tympanic membrane normal.     Left Ear: Tympanic membrane normal.     Nose: Nose normal.     Mouth/Throat:     Mouth: Mucous membranes are moist.     Pharynx: Posterior oropharyngeal erythema present.  Cardiovascular:     Rate and Rhythm: Normal rate and regular rhythm.     Heart sounds: Normal heart sounds.  Pulmonary:     Effort: Pulmonary effort is normal. No respiratory distress.     Breath sounds: Normal breath sounds.  Neurological:     Mental Status: She is alert.      UC Treatments / Results  Labs (all labs ordered are listed, but only abnormal results are displayed) Labs Reviewed  POCT RAPID STREP A (OFFICE)    EKG   Radiology No results found.  Procedures Procedures (including critical care time)  Medications Ordered in UC Medications - No data to display  Initial Impression / Assessment and Plan / UC Course  I have reviewed the triage vital signs and the nursing notes.  Pertinent labs & imaging results that were available during my care of the patient were reviewed by me and considered in my  medical decision making (see chart for details).  Acute upper respiratory infection.  Patient has been symptomatic for 10 days and is not improving with OTC treatment.  Treating today with Zithromax .  Tylenol as needed.  Plain Mucinex  as needed.  Instructed her to follow-up with her PCP if she is not improving.  Education provided on upper respiratory infection.  Patient agrees to plan of care.  Final Clinical Impressions(s) / UC Diagnoses   Final diagnoses:  Acute upper respiratory infection     Discharge Instructions      Take the Zithromax  as directed.  Follow-up with your primary care provider if your symptoms are not improving.      ED Prescriptions     Medication Sig Dispense Auth. Provider   azithromycin  (ZITHROMAX ) 250 MG tablet Take 1 tablet (250 mg total) by mouth daily. Take first 2 tablets together, then 1 every day until finished. 6 tablet Corlis Burnard DEL, NP      PDMP not reviewed this encounter.   Corlis Burnard DEL, NP 01/20/24 [phone]

## 2024-01-21 ENCOUNTER — Ambulatory Visit: Payer: Self-pay

## 2024-01-22 ENCOUNTER — Encounter: Payer: Self-pay | Admitting: Family Medicine

## 2024-01-22 MED ORDER — ALBUTEROL SULFATE HFA 108 (90 BASE) MCG/ACT IN AERS: 2.0000 | INHALATION_SPRAY | RESPIRATORY_TRACT | 5 refills | Status: AC | PRN

## 2024-01-23 ENCOUNTER — Ambulatory Visit: Payer: Self-pay | Admitting: Family Medicine

## 2024-01-23 ENCOUNTER — Telehealth: Payer: Federal, State, Local not specified - PPO | Admitting: Nurse Practitioner

## 2024-01-23 DIAGNOSIS — R112 Nausea with vomiting, unspecified: Secondary | ICD-10-CM | POA: Diagnosis not present

## 2024-01-23 DIAGNOSIS — J069 Acute upper respiratory infection, unspecified: Secondary | ICD-10-CM

## 2024-01-23 MED ORDER — ONDANSETRON 4 MG PO TBDP: 4.0000 mg | ORAL_TABLET | Freq: Three times a day (TID) | ORAL | 0 refills | Status: DC | PRN

## 2024-01-23 NOTE — Progress Notes (Signed)
Virtual Visit Consent   Ariel Compton, you are scheduled for a virtual visit with a [facility] provider today. Just as with appointments in the office, your consent must be obtained to participate. Your consent will be active for this visit and any virtual visit you may have with one of our providers in the next 365 days. If you have a MyChart account, a copy of this consent can be sent to you electronically.  As this is a virtual visit, video technology does not allow for your provider to perform a traditional examination. This may limit your provider's ability to fully assess your condition. If your provider identifies any concerns that need to be evaluated in person or the need to arrange testing (such as labs, EKG, etc.), we will make arrangements to do so. Although advances in technology are sophisticated, we cannot ensure that it will always work on either your end or our end. If the connection with a video visit is poor, the visit may have to be switched to a telephone visit. With either a video or telephone visit, we are not always able to ensure that we have a secure connection.  By engaging in this virtual visit, you consent to the provision of healthcare and authorize for your insurance to be billed (if applicable) for the services provided during this visit. Depending on your insurance coverage, you may receive a charge related to this service.  I need to obtain your verbal consent now. Are you willing to proceed with your visit today? Ariel Compton has provided verbal consent on 01/23/2024 for a virtual visit (video or telephone). Viviano Simas, FNP  Date: 01/23/2024 10:41 AM   Virtual Visit via Video Note   I, Viviano Simas, connected with  Ariel Compton  ([id number], 1956/05/17) on 01/23/24 at 11:30 AM EST by a video-enabled telemedicine application and verified that I am speaking with the correct person using two identifiers.  Location: Patient: Virtual Visit Location Patient:  Home Provider: Virtual Visit Location Provider: Home Office   I discussed the limitations of evaluation and management by telemedicine and the availability of in person appointments. The patient expressed understanding and agreed to proceed.    History of Present Illness: Ariel Compton is a 68 y.o. who identifies as a female who was assigned female at birth, and is being seen today for follow up.  She was at the UC over the weekend with URI symptoms  She was given azithromycin at that time   She was exposed to the flu in her home  Was tested for strep at [facility] and was negative  Was not tested for flu at that time  Today is day 5 of symptoms   Today she is feeling more flu like with body aches, sore throat, diarrhea nausea with vomiting today   She continues to have a cough that she related to her seasonal allergies and is not severe   She did use her Albuterol inhaler this morning     Problems:  Patient Active Problem List   Diagnosis Date Noted   Estrogen deficiency 08/25/2022   Prediabetes 08/08/2022   History of vertebral fracture 08/02/2022   Left leg pain 07/26/2018   Occipital neuralgia of right side 02/07/2017   Colon cancer screening 01/22/2013   Routine general medical examination at a health care facility 11/11/2012   Postmenopausal HRT (hormone replacement therapy) 07/07/2011   Trigger middle finger of right hand 03/16/2010   MENOPAUSE, SURGICAL 09/04/2009  Asthma 08/11/2008   HYPERLIPIDEMIA 12/04/2007   Depression 12/04/2007   Allergic rhinitis 12/04/2007   Disorder of bladder 12/04/2007   Obesity 10/22/2007    Allergies:  Allergies  Allergen Reactions   Prednisone Other (See Comments)    Chills, cough Makes hyper   Medications:  Current Outpatient Medications:    albuterol (VENTOLIN HFA) 108 (90 Base) MCG/ACT inhaler, Inhale 2 puffs into the lungs every 4 (four) hours as needed for wheezing or shortness of breath (or cough)., Disp: 8 g, Rfl: 5    azithromycin (ZITHROMAX) 250 MG tablet, Take 1 tablet (250 mg total) by mouth daily. Take first 2 tablets together, then 1 every day until finished., Disp: 6 tablet, Rfl: 0   citalopram (CELEXA) 20 MG tablet, TAKE 1 TABLET(20 MG) BY MOUTH DAILY, Disp: 90 tablet, Rfl: 3   estradiol (ESTRACE) 0.5 MG tablet, TAKE 1 TABLET(0.5 MG) BY MOUTH DAILY, Disp: 90 tablet, Rfl: 2   gabapentin (NEURONTIN) 600 MG tablet, Take 1 tablet (600 mg total) by mouth 3 (three) times daily., Disp: 270 tablet, Rfl: 1   ipratropium (ATROVENT) 0.06 % nasal spray, Place 2 sprays into both nostrils 4 (four) times daily., Disp: 15 mL, Rfl: 12   methocarbamol (ROBAXIN) 500 MG tablet, Take 1 tablet (500 mg total) by mouth every 8 (eight) hours as needed for muscle spasms (back pain). Caution of sedation, Disp: 30 tablet, Rfl: 3   Multiple Vitamin (MULTIVITAMIN) tablet, Take 1 tablet by mouth daily., Disp: , Rfl:    zolpidem (AMBIEN) 10 MG tablet, Take 1 tablet (10 mg total) by mouth at bedtime as needed for sleep (during travel)., Disp: 30 tablet, Rfl: 0  Observations/Objective: Patient is well-developed, well-nourished in no acute distress.  Resting comfortably  at home.  Head is normocephalic, atraumatic.  No labored breathing.  Speech is clear and coherent with logical content.  Patient is alert and oriented at baseline.    Assessment and Plan:  1. Nausea and vomiting, unspecified vomiting type (Primary)  - ondansetron (ZOFRAN-ODT) 4 MG disintegrating tablet; Take 1 tablet (4 mg total) by mouth every 8 (eight) hours as needed.  Dispense: 20 tablet; Refill: 0  2. Viral URI Symptoms seem consistent with ongoing flu-like symptoms  Advised on hydration  Immune support  BRAT diet  Symptom management with Albuterol and Mucinex   Follow up if no improvement in the next 48 hours  May also finish Azithromycin * take with food          Follow Up Instructions: I discussed the assessment and treatment plan with the  patient. The patient was provided an opportunity to ask questions and all were answered. The patient agreed with the plan and demonstrated an understanding of the instructions.  A copy of instructions were sent to the patient via MyChart unless otherwise noted below.    The patient was advised to call back or seek an in-person evaluation if the symptoms worsen or if the condition fails to improve as anticipated.    Viviano Simas, FNP

## 2024-01-23 NOTE — Telephone Encounter (Signed)
Reason for Disposition . General information question, no triage required and triager able to answer question  Protocols used: Information Only Call - No Triage-A-AH

## 2024-01-23 NOTE — Telephone Encounter (Signed)
  Chief Complaint: info only  Additional Notes: Patient reports received a call to see if virtual appt could scheduled earlier today. Of note, pt already self-scheduled @ 1130. Unknown who the caller was or why she was asked to move up appt. Pt decided to keep appt scheduled.   Copied from CRM [id number]. Topic: Clinical - Red Word Triage >> Jan 23, 2024 10:03 AM [name] wrote: Red Word that prompted transfer to Nurse Triage: Patient saw a provider Sunday at [facility] urgent Care and feel worse after starting a Z-Pak,states throat is still sore and patient cannot swallow, has fever and sweats as well as progressing cough and also vomiting as well as diarrhea, patient stated she has been using provent inhaler to help with breathing. Answer Assessment - Initial Assessment Questions 1. REASON FOR CALL or QUESTION: "What is your reason for calling today?" or "How can I best help you?" or "What question do you have that I can help answer?"     Patient reports received a call to see if virtual appt could scheduled earlier today. Of note, pt already self-scheduled @ 1130. Unknown who the caller was or why she was asked to move up appt. Pt decided to keep appt scheduled.  Protocols used: Information Only Call - No Triage-A-AH

## 2024-01-29 ENCOUNTER — Ambulatory Visit
Admission: RE | Admit: 2024-01-29 | Discharge: 2024-01-29 | Disposition: A | Discharge status: Home / Self Care | Payer: Federal, State, Local not specified - PPO | Source: Ambulatory Visit | Attending: Family Medicine | Admitting: Family Medicine

## 2024-01-29 ENCOUNTER — Encounter: Payer: Self-pay | Admitting: Family Medicine

## 2024-01-29 DIAGNOSIS — Z1231 Encounter for screening mammogram for malignant neoplasm of breast: Secondary | ICD-10-CM | POA: Diagnosis present

## 2024-01-29 DIAGNOSIS — E2839 Other primary ovarian failure: Secondary | ICD-10-CM | POA: Insufficient documentation

## 2024-01-29 DIAGNOSIS — M8580 Other specified disorders of bone density and structure, unspecified site: Secondary | ICD-10-CM | POA: Insufficient documentation

## 2024-02-01 ENCOUNTER — Encounter: Payer: Self-pay | Admitting: Family Medicine

## 2024-02-12 ENCOUNTER — Other Ambulatory Visit: Payer: Self-pay | Admitting: Family Medicine

## 2024-02-12 MED ORDER — ZOLPIDEM TARTRATE 10 MG PO TABS: 10.0000 mg | ORAL_TABLET | Freq: Every evening | ORAL | 0 refills | Status: DC | PRN

## 2024-02-12 NOTE — Telephone Encounter (Signed)
 Name of Medication: Ambien Name of Pharmacy: [facility] Last Fill or Written Date and Quantity: 11/05/23 #30 tabs/ 0 refills Last Office Visit and Type: 08/16/23 CPE Next Office Visit and Type: 08/16/24

## 2024-05-28 ENCOUNTER — Encounter: Payer: Self-pay | Admitting: Family Medicine

## 2024-05-28 MED ORDER — ZOLPIDEM TARTRATE 10 MG PO TABS: 10.0000 mg | ORAL_TABLET | Freq: Every evening | ORAL | 0 refills | Status: DC | PRN

## 2024-05-28 NOTE — Telephone Encounter (Signed)
 Name of Medication: Ambien  Name of Pharmacy: [facility] Last Fill or Written Date and Quantity: 02/12/24 #30 tabs/ 0 refills Last Office Visit and Type: 08/16/23 CPE Next Office Visit and Type: 08/16/24

## 2024-06-15 ENCOUNTER — Encounter: Payer: Self-pay | Admitting: Family Medicine

## 2024-06-17 ENCOUNTER — Telehealth: Payer: Self-pay | Admitting: *Deleted

## 2024-06-17 MED ORDER — GABAPENTIN 600 MG PO TABS: 600.0000 mg | ORAL_TABLET | Freq: Three times a day (TID) | ORAL | 0 refills | Status: DC

## 2024-06-17 NOTE — Telephone Encounter (Signed)
Sent mychart letting pt know Dr. [name]'s comments. 

## 2024-06-17 NOTE — Telephone Encounter (Signed)
 Pt sent a message saying:  Dr Randeen, i left a bottle of my gabapentin  on a trip again . I have 42 days left but that will not get me to September 5 when i see you again . May i have on partial refill to get me to the annual appointment ? Thankyou Ariel Compton

## 2024-06-17 NOTE — Telephone Encounter (Signed)
 I sent it but cannot promise the pharmacy will give it to you  If there is a way to get the lost bottle back , please try  I am currently out of town/out of the office

## 2024-07-06 ENCOUNTER — Other Ambulatory Visit: Payer: Self-pay | Admitting: Family Medicine

## 2024-07-06 DIAGNOSIS — Z79890 Hormone replacement therapy (postmenopausal): Secondary | ICD-10-CM

## 2024-07-08 NOTE — Telephone Encounter (Signed)
 Estradiol  Last rx:  10/05/23, #90 w/ 2 refill Last OV:  08/16/23, CPE Next OV:  08/16/24

## 2024-08-06 ENCOUNTER — Telehealth: Payer: Self-pay | Admitting: Family Medicine

## 2024-08-06 DIAGNOSIS — E7800 Pure hypercholesterolemia, unspecified: Secondary | ICD-10-CM

## 2024-08-06 DIAGNOSIS — Z8781 Personal history of (healed) traumatic fracture: Secondary | ICD-10-CM

## 2024-08-06 DIAGNOSIS — R7303 Prediabetes: Secondary | ICD-10-CM

## 2024-08-06 NOTE — Telephone Encounter (Signed)
-----   Message from Veva JINNY Ferrari sent at 07/23/2024  3:18 PM EDT ----- Regarding: Lab orders for Fri, 8.29.25 Patient is scheduled for CPX labs, please order future labs, Thanks , Veva

## 2024-08-09 ENCOUNTER — Other Ambulatory Visit: Payer: Federal, State, Local not specified - PPO

## 2024-08-09 DIAGNOSIS — R7303 Prediabetes: Secondary | ICD-10-CM

## 2024-08-09 DIAGNOSIS — E7800 Pure hypercholesterolemia, unspecified: Secondary | ICD-10-CM

## 2024-08-09 DIAGNOSIS — Z8781 Personal history of (healed) traumatic fracture: Secondary | ICD-10-CM

## 2024-08-09 LAB — COMPREHENSIVE METABOLIC PANEL WITH GFR
ALT: 8 U/L (ref 0–35)
AST: 15 U/L (ref 0–37)
Albumin: 3.8 g/dL (ref 3.5–5.2)
Alkaline Phosphatase: 33 U/L — ABNORMAL LOW (ref 39–117)
BUN: 15 mg/dL (ref 6–23)
CO2: 30 meq/L (ref 19–32)
Calcium: 9.1 mg/dL (ref 8.4–10.5)
Chloride: 105 meq/L (ref 96–112)
Creatinine, Ser: 0.73 mg/dL (ref 0.40–1.20)
GFR: 85.53 mL/min (ref >60.00)
Glucose, Bld: 89 mg/dL (ref 70–99)
Potassium: 4.6 meq/L (ref 3.5–5.1)
Sodium: 143 meq/L (ref 135–145)
Total Bilirubin: 0.4 mg/dL (ref 0.2–1.2)
Total Protein: 6.5 g/dL (ref 6.0–8.3)

## 2024-08-09 LAB — LIPID PANEL
Cholesterol: 186 mg/dL (ref 0–200)
HDL: 64.2 mg/dL (ref >39.00)
LDL Cholesterol: 107 mg/dL — ABNORMAL HIGH (ref 0–99)
NonHDL: 121.95
Total CHOL/HDL Ratio: 3
Triglycerides: 76 mg/dL (ref 0.0–149.0)
VLDL: 15.2 mg/dL (ref 0.0–40.0)

## 2024-08-09 LAB — VITAMIN D 25 HYDROXY (VIT D DEFICIENCY, FRACTURES): VITD: 15.12 ng/mL — ABNORMAL LOW (ref 30.00–100.00)

## 2024-08-09 LAB — TSH: TSH: 1.81 u[IU]/mL (ref 0.35–5.50)

## 2024-08-09 LAB — HEMOGLOBIN A1C: Hgb A1c MFr Bld: 5.8 % (ref 4.6–6.5)

## 2024-08-11 ENCOUNTER — Ambulatory Visit: Payer: Self-pay | Admitting: Family Medicine

## 2024-08-16 ENCOUNTER — Encounter: Payer: Self-pay | Admitting: Family Medicine

## 2024-08-16 ENCOUNTER — Ambulatory Visit: Payer: Federal, State, Local not specified - PPO | Admitting: Family Medicine

## 2024-08-16 VITALS — BP 116/68 | HR 66 | Temp 97.8°F | Ht 61.5 in | Wt 160.4 lb

## 2024-08-16 DIAGNOSIS — Z23 Encounter for immunization: Secondary | ICD-10-CM | POA: Diagnosis not present

## 2024-08-16 DIAGNOSIS — F341 Dysthymic disorder: Secondary | ICD-10-CM

## 2024-08-16 DIAGNOSIS — R7303 Prediabetes: Secondary | ICD-10-CM

## 2024-08-16 DIAGNOSIS — Z0000 Encounter for general adult medical examination without abnormal findings: Secondary | ICD-10-CM | POA: Diagnosis not present

## 2024-08-16 DIAGNOSIS — Z1211 Encounter for screening for malignant neoplasm of colon: Secondary | ICD-10-CM

## 2024-08-16 DIAGNOSIS — E7800 Pure hypercholesterolemia, unspecified: Secondary | ICD-10-CM

## 2024-08-16 DIAGNOSIS — M8580 Other specified disorders of bone density and structure, unspecified site: Secondary | ICD-10-CM

## 2024-08-16 DIAGNOSIS — M5481 Occipital neuralgia: Secondary | ICD-10-CM

## 2024-08-16 DIAGNOSIS — Z79890 Hormone replacement therapy (postmenopausal): Secondary | ICD-10-CM | POA: Diagnosis not present

## 2024-08-16 MED ORDER — ESTRADIOL 0.5 MG PO TABS
0.5000 mg | ORAL_TABLET | Freq: Every day | ORAL | 3 refills | Status: AC
Start: 2024-08-16 — End: ?

## 2024-08-16 MED ORDER — CITALOPRAM HYDROBROMIDE 20 MG PO TABS
ORAL_TABLET | ORAL | 3 refills | Status: AC
Start: 2024-08-16 — End: ?

## 2024-08-16 NOTE — Progress Notes (Signed)
 Subjective:    Patient ID: Ariel Compton, female    DOB: Apr 29, 1956, 68 y.o.   MRN: [id number]  HPI  Here for health maintenance exam and to review chronic medical problems   Wt Readings from Last 3 Encounters:  08/16/24 160 lb 6 oz (72.7 kg)  08/16/23 182 lb 3.2 oz (82.6 kg)  08/08/22 168 lb 4 oz (76.3 kg)   29.81 kg/m  Vitals:   08/16/24 0823  BP: 116/68  Pulse: 66  Temp: 97.8 F (36.6 C)  SpO2: 97%    Immunization History  Administered Date(s) Administered   Fluad Trivalent(High Dose 65+) 08/16/2023   INFLUENZA, HIGH DOSE SEASONAL PF 08/16/2024   Influenza Whole 09/12/2007, 09/07/2009, 09/10/2013   Influenza,inj,Quad PF,6+ Mos 08/13/2017, 08/20/2018   Influenza-Unspecified 09/12/2014, 08/26/2016, 09/12/2019, 07/26/2021   PFIZER(Purple Top)SARS-COV-2 Vaccination 02/22/2020, 03/17/2020, 09/24/2020   PNEUMOCOCCAL CONJUGATE-20 08/16/2023   Pneumococcal Polysaccharide-23 01/22/2013   Td 07/25/1997, 01/15/2008, 08/27/2009   Tdap 07/30/2019    There are no preventive care reminders to display for this patient.  Has lost weight  Walking  Lots of veggies  Needs more protein    Mammogram 01/2024  Self breast exam- no lumps   Gyn health S/p hysterectomy  Taking estrace  0.5 mg daily   Colon cancer screening  Cologuard was ordered in the past  Wants to do a blood test for colon cancer screening     Bone health  Dexa 01/2024 osteopenia  Falls none  Fractures- remote vertebral fractures  Supplements   ca and vitamin D   Last vitamin D  Lab Results  Component Value Date   VD25OH 15.12 (L) 08/09/2024    Exercise  Walking    Mood    08/16/2024    8:34 AM 08/16/2023    8:48 AM 08/05/2021    9:53 AM 08/04/2020    9:33 AM 07/30/2019    9:52 AM  Depression screen PHQ 2/9  Decreased Interest 0 0 0 1 0  Down, Depressed, Hopeless 0 0 0 1 0  PHQ - 2 Score 0 0 0 2 0  Altered sleeping 0 0 2 3 1   Tired, decreased energy 0 0 1 3 0  Change in appetite 0 0 0 0 0   Feeling bad or failure about yourself  0 0 0 0 0  Trouble concentrating 0 0 0 2 1  Moving slowly or fidgety/restless 0 0 0 0 0  Suicidal thoughts 0 0 0 0 0  PHQ-9 Score 0 0 3 10 2   Difficult doing work/chores Not difficult at all Not difficult at all Somewhat difficult  Not difficult at all   Celexa  20 mg daily  Doing well  Depression  Ambien  prn sleep    Prediabetes Lab Results  Component Value Date   HGBA1C 5.8 08/09/2024   HGBA1C 5.8 08/08/2023   Cholesterol Lab Results  Component Value Date   CHOL 186 08/09/2024   CHOL 207 (H) 08/08/2023   CHOL 181 08/01/2022   Lab Results  Component Value Date   HDL 64.20 08/09/2024   HDL 54.80 08/08/2023   HDL 56.70 08/01/2022   Lab Results  Component Value Date   LDLCALC 107 (H) 08/09/2024   LDLCALC 124 (H) 08/08/2023   LDLCALC 103 (H) 08/01/2022   Lab Results  Component Value Date   TRIG 76.0 08/09/2024   TRIG 142.0 08/08/2023   TRIG 107.0 08/01/2022   Lab Results  Component Value Date   CHOLHDL 3 08/09/2024   CHOLHDL 4 08/08/2023  CHOLHDL 3 08/01/2022   Lab Results  Component Value Date   LDLDIRECT 114.0 01/15/2013   LDLDIRECT 121.4 10/25/2007   LDLDIRECT 121.4 10/23/2007   Significant improvement   Lab Results  Component Value Date   TSH 1.81 08/09/2024   Lab Results  Component Value Date   NA 143 08/09/2024   K 4.6 08/09/2024   CO2 30 08/09/2024   GLUCOSE 89 08/09/2024   BUN 15 08/09/2024   CREATININE 0.73 08/09/2024   CALCIUM 9.1 08/09/2024   GFR 85.53 08/09/2024   GFRNONAA >60 05/17/2021   Lab Results  Component Value Date   ALT 8 08/09/2024   AST 15 08/09/2024   ALKPHOS 33 (L) 08/09/2024   BILITOT 0.4 08/09/2024        Patient Active Problem List   Diagnosis Date Noted   Osteopenia 01/29/2024   Estrogen deficiency 08/25/2022   Prediabetes 08/08/2022   History of vertebral fracture 08/02/2022   Occipital neuralgia of right side 02/07/2017   Colon cancer screening 01/22/2013    Routine general medical examination at a health care facility 11/11/2012   Postmenopausal HRT (hormone replacement therapy) 07/07/2011   MENOPAUSE, SURGICAL 09/04/2009   Asthma 08/11/2008   Hyperlipidemia 12/04/2007   Depression 12/04/2007   Allergic rhinitis 12/04/2007   Disorder of bladder 12/04/2007   Obesity 10/22/2007   Past Medical History:  Diagnosis Date   Allergic rhinitis    Asthma    mild   Contracture of palmar fascia    Depression    Headache    Mixed hyperlipidemia    Obesity, unspecified    Plantar fasciitis    Symptomatic states associated with artificial menopause    Unspecified disorder of bladder    Past Surgical History:  Procedure Laterality Date   ANKLE FRACTURE SURGERY Right    VAGINAL HYSTERECTOMY  2008   Enterocele repair   Social History   Tobacco Use   Smoking status: Never   Smokeless tobacco: Never  Vaping Use   Vaping status: Never Used  Substance Use Topics   Alcohol use: No    Alcohol/week: 0.0 standard drinks of alcohol   Drug use: No   Family History  Problem Relation Age of Onset   Peripheral vascular disease Father        Smoker   Prostate cancer Father    Heart failure Father    Other Father        Heart problems   Stroke Mother    Headache Neg Hx    Breast cancer Neg Hx    Allergies  Allergen Reactions   Prednisone  Other (See Comments)    Chills, cough Makes hyper   Current Outpatient Medications on File Prior to Visit  Medication Sig Dispense Refill   albuterol  (VENTOLIN  HFA) 108 (90 Base) MCG/ACT inhaler Inhale 2 puffs into the lungs every 4 (four) hours as needed for wheezing or shortness of breath (or cough). 8 g 5   gabapentin  (NEURONTIN ) 600 MG tablet Take 1 tablet (600 mg total) by mouth 3 (three) times daily. 270 tablet 0   ipratropium (ATROVENT ) 0.06 % nasal spray Place 2 sprays into both nostrils 4 (four) times daily. 15 mL 12   methocarbamol  (ROBAXIN ) 500 MG tablet Take 1 tablet (500 mg total) by mouth  every 8 (eight) hours as needed for muscle spasms (back pain). Caution of sedation 30 tablet 3   zolpidem  (AMBIEN ) 10 MG tablet Take 1 tablet (10 mg total) by mouth at bedtime as needed for  sleep (during travel). 30 tablet 0   No current facility-administered medications on file prior to visit.    Review of Systems  Constitutional:  Negative for activity change, appetite change, fatigue, fever and unexpected weight change.  HENT:  Negative for congestion, ear pain, rhinorrhea, sinus pressure and sore throat.   Eyes:  Negative for pain, redness and visual disturbance.  Respiratory:  Negative for cough, shortness of breath and wheezing.   Cardiovascular:  Negative for chest pain and palpitations.  Gastrointestinal:  Negative for abdominal pain, blood in stool, constipation and diarrhea.  Endocrine: Negative for polydipsia and polyuria.  Genitourinary:  Negative for dysuria, frequency and urgency.  Musculoskeletal:  Negative for arthralgias, back pain and myalgias.  Skin:  Negative for pallor and rash.  Allergic/Immunologic: Negative for environmental allergies.  Neurological:  Negative for dizziness, syncope and headaches.  Hematological:  Negative for adenopathy. Does not bruise/bleed easily.  Psychiatric/Behavioral:  Negative for decreased concentration and dysphoric mood. The patient is not nervous/anxious.        Objective:   Physical Exam Constitutional:      General: She is not in acute distress.    Appearance: Normal appearance. She is well-developed. She is obese. She is not ill-appearing or diaphoretic.  HENT:     Head: Normocephalic and atraumatic.     Right Ear: Tympanic membrane, ear canal and external ear normal.     Left Ear: Tympanic membrane, ear canal and external ear normal.     Nose: Nose normal. No congestion.     Mouth/Throat:     Mouth: Mucous membranes are moist.     Pharynx: Oropharynx is clear. No posterior oropharyngeal erythema.  Eyes:     General: No  scleral icterus.    Extraocular Movements: Extraocular movements intact.     Conjunctiva/sclera: Conjunctivae normal.     Pupils: Pupils are equal, round, and reactive to light.  Neck:     Thyroid : No thyromegaly.     Vascular: No carotid bruit or JVD.  Cardiovascular:     Rate and Rhythm: Normal rate and regular rhythm.     Pulses: Normal pulses.     Heart sounds: Normal heart sounds.     No gallop.  Pulmonary:     Effort: Pulmonary effort is normal. No respiratory distress.     Breath sounds: Normal breath sounds. No wheezing.     Comments: Good air exch Chest:     Chest wall: No tenderness.  Abdominal:     General: Bowel sounds are normal. There is no distension or abdominal bruit.     Palpations: Abdomen is soft. There is no mass.     Tenderness: There is no abdominal tenderness.     Hernia: No hernia is present.  Genitourinary:    Comments: Breast exam: No mass, nodules, thickening, tenderness, bulging, retraction, inflamation, nipple discharge or skin changes noted.  No axillary or clavicular LA.     Musculoskeletal:        General: No tenderness. Normal range of motion.     Cervical back: Normal range of motion and neck supple. No rigidity. No muscular tenderness.     Right lower leg: No edema.     Left lower leg: No edema.     Comments: No kyphosis   Lymphadenopathy:     Cervical: No cervical adenopathy.  Skin:    General: Skin is warm and dry.     Coloration: Skin is not pale.     Findings: No erythema  or rash.     Comments: Solar lentigines diffusely   Neurological:     Mental Status: She is alert. Mental status is at baseline.     Cranial Nerves: No cranial nerve deficit.     Motor: No abnormal muscle tone.     Coordination: Coordination normal.     Gait: Gait normal.     Deep Tendon Reflexes: Reflexes are normal and symmetric. Reflexes normal.  Psychiatric:        Mood and Affect: Mood normal.        Cognition and Memory: Cognition and memory normal.            Assessment & Plan:   Problem List Items Addressed This Visit       Nervous and Auditory   Occipital neuralgia of right side   Continues gabapentin  600 mg bid to tid      Relevant Medications   citalopram  (CELEXA ) 20 MG tablet     Musculoskeletal and Integument   Osteopenia   Dexa 01/2024  D level low-will supplement this  No new falls/fractures  Past vertebral fractures   Discussed fall prevention, supplements and exercise for bone density          Other   Routine general medical examination at a health care facility - Primary   Reviewed health habits including diet and exercise and skin cancer prevention Reviewed appropriate screening tests for age  Also reviewed health mt list, fam hx and immunization status , as well as social and family history   See HPI Labs reviewed and ordered Health Maintenance  Topic Date Due   Zoster (Shingles) Vaccine (1 of 2) 11/15/2024*   Colon Cancer Screening  08/16/2025*   COVID-19 Vaccine (4 - 2025-26 season) 09/01/2025*   Mammogram  01/28/2025   DTaP/Tdap/Td vaccine (5 - Td or Tdap) 07/29/2029   Pneumococcal Vaccine for age over 72  Completed   Flu Shot  Completed   DEXA scan (bone density measurement)  Completed   Hepatitis C Screening  Completed   HPV Vaccine  Aged Out   Meningitis B Vaccine  Aged Out  *Topic was postponed. The date shown is not the original due date.    Commended better diet and exercise  Cologuard ordered No falls/fractures  Discussed fall prevention, supplements and exercise for bone density  PHQ 0       Prediabetes   Prediabetes  Lab Results  Component Value Date   HGBA1C 5.8 08/09/2024   HGBA1C 5.8 08/08/2023    disc imp of low glycemic diet and wt loss to prevent DM2        Postmenopausal HRT (hormone replacement therapy)   Continues estrace  0.5 mg daily Wants to continue this for bone health and menopause symptoms   Discussed pros/cons risks  Pt notes her pros  outweigh cons and risks  Wants to continue  Will consider weaning in the next year         Relevant Medications   estradiol  (ESTRACE ) 0.5 MG tablet   Hyperlipidemia   Disc goals for lipids and reasons to control them Rev last labs with pt Rev low sat fat diet in detail  LDL 107 Improvement noted       Depression   Doing well with celexa  20 mg daily  PHQ 0  Wants to continue  Ambien  prn sleep      Relevant Medications   citalopram  (CELEXA ) 20 MG tablet   Colon cancer screening   D/w patient mz:neupnwd  for colon cancer screening, including cologuard  vs. colonoscopy.  Risks and benefits of both were discussed and patient voiced understanding.  Pt elects for: cologuard (understanding would need colonoscopy if positive)        Relevant Orders   Cologuard   Other Visit Diagnoses       Need for influenza vaccination       Relevant Orders   Flu vaccine HIGH DOSE PF(Fluzone Trivalent) (Completed)

## 2024-08-16 NOTE — Patient Instructions (Addendum)
 Keep eating healthy   Incorporate more protein   Keep walking  Add some strength training to your routine, this is important for bone and brain health and can reduce your risk of falls and help your body use insulin properly and regulate weight  Light weights, exercise bands , and internet videos are a good way to start  Yoga (chair or regular), machines , floor exercises or a gym with machines are also good options   I put the referral in for cologuard  Please let us  know if you don't hear in 1-2 weeks to set that up (mychart message or call or letter)   Continue your calcium plus D  Add another 4000-5000 international units of D3 daily over the counter

## 2024-08-17 ENCOUNTER — Encounter: Payer: Self-pay | Admitting: Family Medicine

## 2024-08-17 NOTE — Assessment & Plan Note (Signed)
 Doing well with celexa  20 mg daily  PHQ 0  Wants to continue  Ambien  prn sleep

## 2024-08-17 NOTE — Assessment & Plan Note (Signed)
 Prediabetes  Lab Results  Component Value Date   HGBA1C 5.8 08/09/2024   HGBA1C 5.8 08/08/2023    disc imp of low glycemic diet and wt loss to prevent DM2

## 2024-08-17 NOTE — Assessment & Plan Note (Signed)
 Reviewed health habits including diet and exercise and skin cancer prevention Reviewed appropriate screening tests for age  Also reviewed health mt list, fam hx and immunization status , as well as social and family history   See HPI Labs reviewed and ordered Health Maintenance  Topic Date Due   Zoster (Shingles) Vaccine (1 of 2) 11/15/2024*   Colon Cancer Screening  08/16/2025*   COVID-19 Vaccine (4 - 2025-26 season) 09/01/2025*   Mammogram  01/28/2025   DTaP/Tdap/Td vaccine (5 - Td or Tdap) 07/29/2029   Pneumococcal Vaccine for age over 47  Completed   Flu Shot  Completed   DEXA scan (bone density measurement)  Completed   Hepatitis C Screening  Completed   HPV Vaccine  Aged Out   Meningitis B Vaccine  Aged Out  *Topic was postponed. The date shown is not the original due date.    Commended better diet and exercise  Cologuard ordered No falls/fractures  Discussed fall prevention, supplements and exercise for bone density  PHQ 0

## 2024-08-17 NOTE — Assessment & Plan Note (Addendum)
 Continues estrace  0.5 mg daily Wants to continue this for bone health and menopause symptoms   Discussed pros/cons risks  Pt notes her pros outweigh cons and risks  Wants to continue  Will consider weaning in the next year

## 2024-08-17 NOTE — Assessment & Plan Note (Signed)
 D/w patient mz:neupnwd for colon cancer screening, including cologuard  vs. colonoscopy.  Risks and benefits of both were discussed and patient voiced understanding.  Pt elects for: cologuard (understanding would need colonoscopy if positive)

## 2024-08-17 NOTE — Assessment & Plan Note (Signed)
 Disc goals for lipids and reasons to control them Rev last labs with pt Rev low sat fat diet in detail  LDL 107 Improvement noted

## 2024-08-17 NOTE — Assessment & Plan Note (Signed)
 Dexa 01/2024  D level low-will supplement this  No new falls/fractures  Past vertebral fractures   Discussed fall prevention, supplements and exercise for bone density

## 2024-08-17 NOTE — Assessment & Plan Note (Signed)
 Continues gabapentin  600 mg bid to tid

## 2024-09-13 ENCOUNTER — Other Ambulatory Visit: Payer: Self-pay | Admitting: Family Medicine

## 2024-09-13 NOTE — Telephone Encounter (Signed)
 Name of Medication: Ambien  Name of Pharmacy: [facility] Last Fill or Written Date and Quantity: 05/28/24 #30 tabs/ 0 refills Last Office Visit and Type: 08/16/24 CPE Next Office Visit and Type: 08/20/25 CPE

## 2024-09-16 ENCOUNTER — Other Ambulatory Visit: Payer: Self-pay | Admitting: Family Medicine

## 2024-09-17 NOTE — Telephone Encounter (Signed)
 Last filled on 06/17/24 #270 tab/ 0 refills   CPE scheduled on 08/20/25

## 2024-12-23 ENCOUNTER — Other Ambulatory Visit: Payer: Self-pay | Admitting: Family Medicine

## 2024-12-24 NOTE — Telephone Encounter (Signed)
 Name of Medication: Ambien  Name of Pharmacy: [facility] Last Fill or Written Date and Quantity: 09/13/24 #30 tabs/ 0 refills Last Office Visit and Type: 08/16/24 CPE Next Office Visit and Type: 08/20/25 CPE

## 2025-01-28 ENCOUNTER — Encounter: Admitting: Obstetrics & Gynecology

## 2025-08-13 ENCOUNTER — Other Ambulatory Visit

## 2025-08-20 ENCOUNTER — Encounter: Admitting: Family Medicine
